# Patient Record
Sex: Female | Born: 1965 | Race: Black or African American | Hispanic: No | Smoking: Never smoker
Health system: Southern US, Community
[De-identification: ages and names within clinical notes are randomized; demographics above are authoritative.]

## PROBLEM LIST (undated history)

## (undated) DIAGNOSIS — C189 Malignant neoplasm of colon, unspecified: Secondary | ICD-10-CM

## (undated) DIAGNOSIS — D490 Neoplasm of unspecified behavior of digestive system: Secondary | ICD-10-CM

## (undated) DIAGNOSIS — I10 Essential (primary) hypertension: Secondary | ICD-10-CM

## (undated) DIAGNOSIS — C229 Malignant neoplasm of liver, not specified as primary or secondary: Secondary | ICD-10-CM

## (undated) DIAGNOSIS — C801 Malignant (primary) neoplasm, unspecified: Secondary | ICD-10-CM

## (undated) HISTORY — PX: FOOT SURGERY: SHX648

## (undated) HISTORY — PX: TUMOR REMOVAL: SHX12

---

## 1998-10-04 ENCOUNTER — Other Ambulatory Visit: Admission: RE | Admit: 1998-10-04 | Discharge: 1998-10-04 | Payer: Self-pay | Admitting: Obstetrics & Gynecology

## 1998-10-21 ENCOUNTER — Other Ambulatory Visit: Admission: RE | Admit: 1998-10-21 | Discharge: 1998-10-21 | Payer: Self-pay | Admitting: Obstetrics and Gynecology

## 2007-11-30 ENCOUNTER — Other Ambulatory Visit: Admission: RE | Admit: 2007-11-30 | Discharge: 2007-11-30 | Payer: Self-pay | Admitting: Internal Medicine

## 2008-09-25 ENCOUNTER — Emergency Department (HOSPITAL_COMMUNITY): Admission: EM | Admit: 2008-09-25 | Discharge: 2008-09-26 | Payer: Self-pay | Admitting: Emergency Medicine

## 2008-12-19 ENCOUNTER — Encounter: Admission: RE | Admit: 2008-12-19 | Discharge: 2008-12-19 | Payer: Self-pay | Admitting: Family Medicine

## 2009-02-28 ENCOUNTER — Encounter: Admission: RE | Admit: 2009-02-28 | Discharge: 2009-02-28 | Payer: Self-pay | Admitting: Family Medicine

## 2010-07-08 ENCOUNTER — Emergency Department (HOSPITAL_COMMUNITY)
Admission: EM | Admit: 2010-07-08 | Discharge: 2010-07-08 | Payer: Self-pay | Source: Home / Self Care | Admitting: Emergency Medicine

## 2010-07-09 ENCOUNTER — Encounter (INDEPENDENT_AMBULATORY_CARE_PROVIDER_SITE_OTHER): Payer: Self-pay | Admitting: Emergency Medicine

## 2010-07-09 ENCOUNTER — Ambulatory Visit (HOSPITAL_COMMUNITY)
Admission: RE | Admit: 2010-07-09 | Discharge: 2010-07-09 | Payer: Self-pay | Source: Home / Self Care | Attending: Emergency Medicine | Admitting: Emergency Medicine

## 2010-08-10 ENCOUNTER — Encounter: Payer: Self-pay | Admitting: Internal Medicine

## 2010-10-30 LAB — DIFFERENTIAL
Basophils Absolute: 0 10*3/uL (ref 0.0–0.1)
Basophils Relative: 1 % (ref 0–1)
Eosinophils Absolute: 0.2 10*3/uL (ref 0.0–0.7)
Eosinophils Relative: 3 % (ref 0–5)
Lymphocytes Relative: 27 % (ref 12–46)

## 2010-10-30 LAB — BASIC METABOLIC PANEL
Calcium: 8.8 mg/dL (ref 8.4–10.5)
GFR calc non Af Amer: >60 mL/min (ref >60)
Glucose, Bld: 122 mg/dL — ABNORMAL HIGH (ref 70–99)
Potassium: 3.4 mEq/L — ABNORMAL LOW (ref 3.5–5.1)
Sodium: 135 mEq/L (ref 135–145)

## 2010-10-30 LAB — HEPATIC FUNCTION PANEL
ALT: 19 U/L (ref 0–35)
AST: 21 U/L (ref 0–37)
Albumin: 3.4 g/dL — ABNORMAL LOW (ref 3.5–5.2)
Alkaline Phosphatase: 59 U/L (ref 39–117)
Bilirubin, Direct: <0.1 mg/dL (ref 0.0–0.3)
Total Bilirubin: 0.5 mg/dL (ref 0.3–1.2)

## 2010-10-30 LAB — CBC
HCT: 33.3 % — ABNORMAL LOW (ref 36.0–46.0)
MCHC: 33.7 g/dL (ref 30.0–36.0)
MCV: 81 fL (ref 78.0–100.0)
Platelets: 271 10*3/uL (ref 150–400)
RDW: 12.5 % (ref 11.5–15.5)

## 2010-10-30 LAB — POCT CARDIAC MARKERS

## 2010-12-15 ENCOUNTER — Emergency Department (HOSPITAL_COMMUNITY): Payer: Self-pay

## 2010-12-15 ENCOUNTER — Emergency Department (HOSPITAL_COMMUNITY)
Admission: EM | Admit: 2010-12-15 | Discharge: 2010-12-15 | Disposition: A | Discharge status: Home / Self Care | Payer: Self-pay | Attending: Emergency Medicine | Admitting: Emergency Medicine

## 2010-12-15 DIAGNOSIS — R220 Localized swelling, mass and lump, head: Secondary | ICD-10-CM | POA: Insufficient documentation

## 2010-12-15 DIAGNOSIS — R6883 Chills (without fever): Secondary | ICD-10-CM | POA: Insufficient documentation

## 2010-12-15 DIAGNOSIS — R05 Cough: Secondary | ICD-10-CM | POA: Insufficient documentation

## 2010-12-15 DIAGNOSIS — K219 Gastro-esophageal reflux disease without esophagitis: Secondary | ICD-10-CM | POA: Insufficient documentation

## 2010-12-15 DIAGNOSIS — R079 Chest pain, unspecified: Secondary | ICD-10-CM | POA: Insufficient documentation

## 2010-12-15 DIAGNOSIS — R209 Unspecified disturbances of skin sensation: Secondary | ICD-10-CM | POA: Insufficient documentation

## 2010-12-15 DIAGNOSIS — R51 Headache: Secondary | ICD-10-CM | POA: Insufficient documentation

## 2010-12-15 DIAGNOSIS — R002 Palpitations: Secondary | ICD-10-CM | POA: Insufficient documentation

## 2010-12-15 DIAGNOSIS — R0602 Shortness of breath: Secondary | ICD-10-CM | POA: Insufficient documentation

## 2010-12-15 DIAGNOSIS — R0609 Other forms of dyspnea: Secondary | ICD-10-CM | POA: Insufficient documentation

## 2010-12-15 DIAGNOSIS — R000 Tachycardia, unspecified: Secondary | ICD-10-CM | POA: Insufficient documentation

## 2010-12-15 DIAGNOSIS — R059 Cough, unspecified: Secondary | ICD-10-CM | POA: Insufficient documentation

## 2010-12-15 DIAGNOSIS — R0989 Other specified symptoms and signs involving the circulatory and respiratory systems: Secondary | ICD-10-CM | POA: Insufficient documentation

## 2010-12-15 DIAGNOSIS — F988 Other specified behavioral and emotional disorders with onset usually occurring in childhood and adolescence: Secondary | ICD-10-CM | POA: Insufficient documentation

## 2010-12-15 DIAGNOSIS — J3489 Other specified disorders of nose and nasal sinuses: Secondary | ICD-10-CM | POA: Insufficient documentation

## 2012-12-08 ENCOUNTER — Encounter (HOSPITAL_COMMUNITY): Payer: Self-pay | Admitting: *Deleted

## 2012-12-08 DIAGNOSIS — M545 Low back pain, unspecified: Secondary | ICD-10-CM | POA: Insufficient documentation

## 2012-12-08 DIAGNOSIS — N390 Urinary tract infection, site not specified: Secondary | ICD-10-CM | POA: Insufficient documentation

## 2012-12-08 DIAGNOSIS — Z3202 Encounter for pregnancy test, result negative: Secondary | ICD-10-CM | POA: Insufficient documentation

## 2012-12-08 DIAGNOSIS — M5430 Sciatica, unspecified side: Secondary | ICD-10-CM | POA: Insufficient documentation

## 2012-12-08 DIAGNOSIS — Z79899 Other long term (current) drug therapy: Secondary | ICD-10-CM | POA: Insufficient documentation

## 2012-12-08 LAB — POCT PREGNANCY, URINE: Preg Test, Ur: NEGATIVE

## 2012-12-08 LAB — URINALYSIS, ROUTINE W REFLEX MICROSCOPIC
Glucose, UA: NEGATIVE mg/dL
Ketones, ur: NEGATIVE mg/dL
Nitrite: NEGATIVE
Protein, ur: NEGATIVE mg/dL

## 2012-12-08 LAB — COMPREHENSIVE METABOLIC PANEL
AST: 19 U/L (ref 0–37)
Albumin: 3.8 g/dL (ref 3.5–5.2)
BUN: 17 mg/dL (ref 6–23)
CO2: 23 mEq/L (ref 19–32)
Calcium: 9.4 mg/dL (ref 8.4–10.5)
Creatinine, Ser: 1.04 mg/dL (ref 0.50–1.10)
GFR calc non Af Amer: 63 mL/min — ABNORMAL LOW (ref >90)
Total Bilirubin: 0.4 mg/dL (ref 0.3–1.2)

## 2012-12-08 LAB — CBC
HCT: 28.8 % — ABNORMAL LOW (ref 36.0–46.0)
MCH: 22 pg — ABNORMAL LOW (ref 26.0–34.0)
MCV: 69.6 fL — ABNORMAL LOW (ref 78.0–100.0)
Platelets: 341 10*3/uL (ref 150–400)
RDW: 16.6 % — ABNORMAL HIGH (ref 11.5–15.5)

## 2012-12-08 LAB — LIPASE, BLOOD: Lipase: 39 U/L (ref 11–59)

## 2012-12-08 LAB — URINE MICROSCOPIC-ADD ON

## 2012-12-08 NOTE — ED Notes (Signed)
Pt reports (L) flank pain x 3-4 days.  States that the pain radiates around to her groin.  Pt was incontinent of urine this evening and reports difficulty urinating.

## 2012-12-09 ENCOUNTER — Emergency Department (HOSPITAL_COMMUNITY)
Admission: EM | Admit: 2012-12-09 | Discharge: 2012-12-09 | Disposition: A | Discharge status: Home / Self Care | Payer: Self-pay | Attending: Emergency Medicine | Admitting: Emergency Medicine

## 2012-12-09 DIAGNOSIS — N390 Urinary tract infection, site not specified: Secondary | ICD-10-CM

## 2012-12-09 DIAGNOSIS — M5432 Sciatica, left side: Secondary | ICD-10-CM

## 2012-12-09 MED ORDER — PREDNISONE 20 MG PO TABS: ORAL_TABLET | ORAL | Status: DC

## 2012-12-09 MED ORDER — CYCLOBENZAPRINE HCL 10 MG PO TABS: 10.0000 mg | ORAL_TABLET | Freq: Two times a day (BID) | ORAL | Status: DC | PRN

## 2012-12-09 MED ORDER — HYDROCODONE-ACETAMINOPHEN 5-325 MG PO TABS: 2.0000 | ORAL_TABLET | ORAL | Status: DC | PRN

## 2012-12-09 MED ORDER — NAPROXEN 500 MG PO TABS: 500.0000 mg | ORAL_TABLET | Freq: Two times a day (BID) | ORAL | Status: DC

## 2012-12-09 MED ORDER — SULFAMETHOXAZOLE-TRIMETHOPRIM 800-160 MG PO TABS: 1.0000 | ORAL_TABLET | Freq: Two times a day (BID) | ORAL | Status: DC

## 2012-12-09 NOTE — ED Provider Notes (Signed)
History     CSN: [id number]  Arrival date & time 12/08/12  2239   First MD Initiated Contact with Patient 12/09/12 0054      Chief Complaint  Patient presents with  . Flank Pain    (Consider location/radiation/quality/duration/timing/severity/associated sxs/prior treatment) HPI Comments: Patient comes to the ER for evaluation of pain in the left lower back area that started 4 days ago. Patient reports that the pain is sharp and worsens with bending and twisting. She denies any injury. She has noticed that the pain started to radiate down into her left hip and at times only down the left thigh into the knee area. No numbness, tingling or weakness. She has not noticed any change in bowel or bladder function, denies urinary symptoms. Patient was a IT consultant, does a lot of manual labor at work.  Patient is a 47 y.o. female presenting with flank pain.  Flank Pain    History reviewed. No pertinent past medical history.  History reviewed. No pertinent past surgical history.  History reviewed. No pertinent family history.  History  Substance Use Topics  . Smoking status: Never Smoker   . Smokeless tobacco: Not on file  . Alcohol Use: No    OB History   Grav Para Term Preterm Abortions TAB SAB Ect Mult Living                  Review of Systems  Gastrointestinal: Negative for nausea, vomiting, diarrhea and constipation.  Genitourinary: Positive for flank pain. Negative for dysuria, frequency and hematuria.  All other systems reviewed and are negative.    Allergies  Review of patient's allergies indicates no known allergies.  Home Medications   Current Outpatient Rx  Name  Route  Sig  Dispense  Refill  . amphetamine-dextroamphetamine (ADDERALL) 20 MG tablet   Oral   Take 20 mg by mouth 2 (two) times daily.         . citalopram (CELEXA) 10 MG tablet   Oral   Take 10 mg by mouth daily.           There were no vitals taken for this visit.  Physical Exam   Constitutional: She is oriented to person, place, and time. She appears well-developed and well-nourished. No distress.  HENT:  Head: Normocephalic and atraumatic.  Right Ear: Hearing normal.  Left Ear: Hearing normal.  Nose: Nose normal.  Mouth/Throat: Oropharynx is clear and moist and mucous membranes are normal.  Eyes: Conjunctivae and EOM are normal. Pupils are equal, round, and reactive to light.  Neck: Normal range of motion. Neck supple.  Cardiovascular: Regular rhythm, S1 normal and S2 normal.  Exam reveals no gallop and no friction rub.   No murmur heard. Pulmonary/Chest: Effort normal and breath sounds normal. No respiratory distress. She exhibits no tenderness.  Abdominal: Soft. Normal appearance and bowel sounds are normal. There is no hepatosplenomegaly. There is no tenderness. There is no rebound, no guarding, no tenderness at McBurney's point and negative Murphy's sign. No hernia.  Musculoskeletal: Normal range of motion.       Lumbar back: She exhibits tenderness. She exhibits no bony tenderness.       Back:  Neurological: She is alert and oriented to person, place, and time. She has normal strength. No cranial nerve deficit or sensory deficit. Coordination normal. GCS eye subscore is 4. GCS verbal subscore is 5. GCS motor subscore is 6.  Reflex Scores:      Patellar reflexes are 2+ on  the right side and 2+ on the left side. Skin: Skin is warm, dry and intact. No rash noted. No cyanosis.  Psychiatric: She has a normal mood and affect. Her speech is normal and behavior is normal. Thought content normal.    ED Course  Procedures (including critical care time)  Labs Reviewed  CBC - Abnormal; Notable for the following:    Hemoglobin 9.1 (*)    HCT 28.8 (*)    MCV 69.6 (*)    MCH 22.0 (*)    RDW 16.6 (*)    All other components within normal limits  COMPREHENSIVE METABOLIC PANEL - Abnormal; Notable for the following:    Glucose, Bld 114 (*)    GFR calc non Af Amer 63  (*)    GFR calc Af Amer 73 (*)    All other components within normal limits  URINALYSIS, ROUTINE W REFLEX MICROSCOPIC - Abnormal; Notable for the following:    APPearance CLOUDY (*)    Bilirubin Urine SMALL (*)    Leukocytes, UA SMALL (*)    All other components within normal limits  URINE MICROSCOPIC-ADD ON - Abnormal; Notable for the following:    Squamous Epithelial / LPF FEW (*)    All other components within normal limits  LIPASE, BLOOD  POCT PREGNANCY, URINE   No results found.   Diagnosis: Sciatica    MDM  Patient presents to the ER with complaints of pain in the left lower back area which has now become particular down into the leg region. She has normal strength, sensation and reflexes in the lower extremities. Urinalysis was equivocal, possible infection present. No obvious hematuria or concern for kidney stone based on the patient's cousin patient. Her abdominal exam is benign, no sign of acute surgical process. Patient will be treated with empiric sciatica treatment. Will add Bactrim because of the urinary findings. Patient is to rest for 2 days.        Gilda Crease, MD 12/09/12 [phone]

## 2012-12-13 ENCOUNTER — Encounter (HOSPITAL_COMMUNITY): Payer: Self-pay | Admitting: *Deleted

## 2012-12-13 ENCOUNTER — Emergency Department (HOSPITAL_COMMUNITY)
Admission: EM | Admit: 2012-12-13 | Discharge: 2012-12-13 | Disposition: A | Discharge status: Home / Self Care | Payer: Self-pay | Attending: Emergency Medicine | Admitting: Emergency Medicine

## 2012-12-13 ENCOUNTER — Emergency Department (HOSPITAL_COMMUNITY): Payer: Self-pay

## 2012-12-13 DIAGNOSIS — IMO0002 Reserved for concepts with insufficient information to code with codable children: Secondary | ICD-10-CM | POA: Insufficient documentation

## 2012-12-13 DIAGNOSIS — Z87440 Personal history of urinary (tract) infections: Secondary | ICD-10-CM | POA: Insufficient documentation

## 2012-12-13 DIAGNOSIS — M79609 Pain in unspecified limb: Secondary | ICD-10-CM | POA: Insufficient documentation

## 2012-12-13 DIAGNOSIS — Z79899 Other long term (current) drug therapy: Secondary | ICD-10-CM | POA: Insufficient documentation

## 2012-12-13 DIAGNOSIS — M5416 Radiculopathy, lumbar region: Secondary | ICD-10-CM

## 2012-12-13 DIAGNOSIS — N838 Other noninflammatory disorders of ovary, fallopian tube and broad ligament: Secondary | ICD-10-CM | POA: Insufficient documentation

## 2012-12-13 LAB — URINE MICROSCOPIC-ADD ON

## 2012-12-13 LAB — URINALYSIS, ROUTINE W REFLEX MICROSCOPIC
Bilirubin Urine: NEGATIVE
Glucose, UA: NEGATIVE mg/dL
Hgb urine dipstick: NEGATIVE
Specific Gravity, Urine: 1.026 (ref 1.005–1.030)
pH: 6 (ref 5.0–8.0)

## 2012-12-13 MED ORDER — SODIUM CHLORIDE 0.9 % IV BOLUS (SEPSIS)
1000.0000 mL | Freq: Once | INTRAVENOUS | Status: AC
  Administered 2012-12-13: 1000 mL via INTRAVENOUS

## 2012-12-13 MED ORDER — HYDROMORPHONE HCL PF 1 MG/ML IJ SOLN
1.0000 mg | Freq: Once | INTRAMUSCULAR | Status: AC
  Administered 2012-12-13: 1 mg via INTRAVENOUS
  Filled 2012-12-13: qty 1

## 2012-12-13 MED ORDER — OXYCODONE-ACETAMINOPHEN 5-325 MG PO TABS: 1.0000 | ORAL_TABLET | Freq: Four times a day (QID) | ORAL | Status: DC | PRN

## 2012-12-13 MED ORDER — ONDANSETRON HCL 4 MG/2ML IJ SOLN
4.0000 mg | Freq: Once | INTRAMUSCULAR | Status: AC
  Administered 2012-12-13: 4 mg via INTRAVENOUS
  Filled 2012-12-13: qty 2

## 2012-12-13 NOTE — ED Notes (Signed)
Pt is here with 5 day history of left lower back pain that now is radiating down left leg.  Pt now reports numbness to the left leg numbness for 4 days. Pulse present.  Pt has pain with movement.  Pt reports she had some tingling in right hand and right leg for one short episode

## 2012-12-13 NOTE — ED Notes (Signed)
Spoke to MRI aware of pt, will be able to send for pt around 20:00

## 2012-12-13 NOTE — ED Notes (Signed)
Pt complaining of left side pain, and left leg pain.

## 2012-12-13 NOTE — ED Notes (Addendum)
Pt reports pain to left lower back radiating to left leg. Here four days ago and dx with sciatica. States medications are not helping. Pain worse, now reports numbness from left hip to left knee. Pain improved when standing. Also reports urinary incontinence. No fecal incontinence.

## 2012-12-13 NOTE — ED Notes (Signed)
MD at bedside. 

## 2012-12-13 NOTE — ED Provider Notes (Signed)
History     CSN: [id number]  Arrival date & time 12/13/12  1449   First MD Initiated Contact with Patient 12/13/12 1842      Chief Complaint  Patient presents with  . Back Pain  . Leg Pain    (Consider location/radiation/quality/duration/timing/severity/associated sxs/prior treatment) Patient is a 47 y.o. female presenting with back pain and leg pain.  Back Pain Associated symptoms: leg pain   Leg Pain Associated symptoms: back pain    Pt with no significant PMH has had about a week of L lower back pain radiating into L leg. Seen for same 4 days ago, had labs and UA which was concerning for early UTI. Treated for sciatica and UTI. She reports no improvement and in fact symptoms have been worsening. States entire L leg is numb, no fecal incontinence or urinary retention. Denies any specific injury but does a lot of lifting and bending at work.   History reviewed. No pertinent past medical history.  History reviewed. No pertinent past surgical history.  No family history on file.  History  Substance Use Topics  . Smoking status: Never Smoker   . Smokeless tobacco: Not on file  . Alcohol Use: No    OB History   Grav Para Term Preterm Abortions TAB SAB Ect Mult Living                  Review of Systems  Musculoskeletal: Positive for back pain.   All other systems reviewed and are negative except as noted in HPI.   Allergies  Review of patient's allergies indicates no known allergies.  Home Medications   Current Outpatient Rx  Name  Route  Sig  Dispense  Refill  . amphetamine-dextroamphetamine (ADDERALL) 20 MG tablet   Oral   Take 20 mg by mouth 2 (two) times daily.         . citalopram (CELEXA) 10 MG tablet   Oral   Take 10 mg by mouth daily.         . cyclobenzaprine (FLEXERIL) 10 MG tablet   Oral   Take 1 tablet (10 mg total) by mouth 2 (two) times daily as needed for muscle spasms.   20 tablet   0   . HYDROcodone-acetaminophen (NORCO/VICODIN)  5-325 MG per tablet   Oral   Take 2 tablets by mouth every 4 (four) hours as needed for pain.   15 tablet   0   . naproxen (NAPROSYN) 500 MG tablet   Oral   Take 1 tablet (500 mg total) by mouth 2 (two) times daily.   30 tablet   0   . predniSONE (DELTASONE) 20 MG tablet      3 tabs po daily x 3 days, then 2 tabs x 3 days, then 1.5 tabs x 3 days, then 1 tab x 3 days, then 0.5 tabs x 3 days   27 tablet   0   . sulfamethoxazole-trimethoprim (SEPTRA DS) 800-160 MG per tablet   Oral   Take 1 tablet by mouth every 12 (twelve) hours.   10 tablet   0     BP 174/86  Pulse 67  Temp(Src) 98.4 F (36.9 C) (Oral)  Resp 16  Ht 5\' 5"  (1.651 m)  Wt 144 lb 8 oz (65.545 kg)  BMI 24.05 kg/m2  SpO2 100%  Physical Exam  Nursing note and vitals reviewed. Constitutional: She is oriented to person, place, and time. She appears well-developed and well-nourished.  HENT:  Head: Normocephalic  and atraumatic.  Eyes: EOM are normal. Pupils are equal, round, and reactive to light.  Neck: Normal range of motion. Neck supple.  Cardiovascular: Normal rate, normal heart sounds and intact distal pulses.   Pulmonary/Chest: Effort normal and breath sounds normal.  Abdominal: Bowel sounds are normal. She exhibits no distension. There is no tenderness.  Musculoskeletal: Normal range of motion. She exhibits tenderness (L lower back and sciatic notch). She exhibits no edema.  Neurological: She is alert and oriented to person, place, and time. She has normal strength. She displays normal reflexes. No cranial nerve deficit or sensory deficit.  Positive ipsilateral and contralateral straight leg raise  Skin: Skin is warm and dry. No rash noted.  Psychiatric: She has a normal mood and affect.    ED Course  Procedures (including critical care time)  Labs Reviewed  URINALYSIS, ROUTINE W REFLEX MICROSCOPIC - Abnormal; Notable for the following:    Leukocytes, UA SMALL (*)    All other components within  normal limits  URINE MICROSCOPIC-ADD ON - Abnormal; Notable for the following:    Squamous Epithelial / LPF FEW (*)    Bacteria, UA FEW (*)    All other components within normal limits  URINE CULTURE   Mr Lumbar Spine Wo Contrast  12/13/2012   *RADIOLOGY REPORT*  Clinical Data: Lumbar radiculopathy.  Left leg pain  MRI LUMBAR SPINE WITHOUT CONTRAST  Technique:  Multiplanar and multiecho pulse sequences of the lumbar spine were obtained without intravenous contrast.  Comparison: None.  Findings: Normal alignment.  Negative for fracture.  Conus medullaris is normal and terminates at L1-2.  L1-2:  Negative  L2-3:  Negative  L3-4:  Small to moderate  left lateral disc protrusion which could cause impingement of the left L3 nerve root.  L4-5: Small left-sided disc protrusion in the foramen and lateral to the foramen.  Minimal displacement of the left L4 nerve root lateral to the foramen.  Mild facet hypertrophy  L5-S1:  Mild disc degeneration without stenosis.  6.4 x 7.3 cm cyst in the left pelvis.  This appears to be retroperitoneal rather than adnexal in  location.  There is mild nodularity of the wall of the cyst.  There is a similar smaller cyst on the right measuring 22 mm.  This is located medial to the right common iliac artery.  IMPRESSION: Left lateral disc protrusion L3-4 with displacement of the left L3 nerve root.  This could be a symptomatic disc protrusion.  Small left foraminal and left lateral disc protrusion L4-5.  Large complex cyst in the left pelvis with nodularity in the wall. This raises the possibility of neoplasm.  There is a similar smaller cyst on the right.  Further evaluation of the pelvis is suggested to  determine this could be ovarian etiology versus retroperitoneal.  MRI of the pelvis with contrast is suggested for further evaluation.   Original Report Authenticated By: Janeece Riggers, M.D.     1. Lumbar radiculopathy   2. Pelvic cyst       MDM  Reviewed MRI results with  the patient and family at bedside including incidental pelvic mass. Her current symptoms likely due to lumbar disk protrusion, advised Neurosurgery followup. Also recommend close follow up for pelvic mass/cyst. She has Ob/Gyn.         Charles B. Bernette Mayers, MD 12/13/12 2131

## 2012-12-14 LAB — URINE CULTURE: Culture: NO GROWTH

## 2012-12-20 ENCOUNTER — Ambulatory Visit (INDEPENDENT_AMBULATORY_CARE_PROVIDER_SITE_OTHER): Payer: Self-pay | Admitting: Sports Medicine

## 2012-12-20 ENCOUNTER — Encounter: Payer: Self-pay | Admitting: Sports Medicine

## 2012-12-20 VITALS — BP 124/80 | Ht 65.0 in | Wt 142.0 lb

## 2012-12-20 DIAGNOSIS — M5416 Radiculopathy, lumbar region: Secondary | ICD-10-CM

## 2012-12-20 DIAGNOSIS — IMO0002 Reserved for concepts with insufficient information to code with codable children: Secondary | ICD-10-CM

## 2012-12-20 MED ORDER — KETOROLAC TROMETHAMINE 60 MG/2ML IM SOLN
60.0000 mg | Freq: Once | INTRAMUSCULAR | Status: AC
  Administered 2012-12-20: 60 mg via INTRAMUSCULAR

## 2012-12-20 MED ORDER — PREDNISONE 10 MG PO KIT: PACK | ORAL | Status: DC

## 2012-12-20 MED ORDER — METHYLPREDNISOLONE ACETATE 80 MG/ML IJ SUSP
80.0000 mg | Freq: Once | INTRAMUSCULAR | Status: AC
  Administered 2012-12-20: 80 mg via INTRAMUSCULAR

## 2012-12-20 MED ORDER — PREGABALIN 75 MG PO CAPS: ORAL_CAPSULE | ORAL | Status: DC

## 2012-12-20 NOTE — Progress Notes (Signed)
Subjective:    Patient ID: Sheila Perkins, female    DOB: [birth year], 47 y.o.   MRN: [id number]  HPI chief complaint low back pain  Very pleasant 47 year old female comes in today complaining of low back pain. Pain began rather acutely on May 20 but without any specific injury. She localizes the pain to the left side of her low back with radiating pain down her left thigh and just inferior to the left knee. There is associated numbness and tingling as well. She was initially seen in the emergency room on May 23. She was placed on antibiotics for a urinary tract infection and also treated for sciatica. When her pain persisted she was seen back in the emergency room on May 27. An MRI at that time showed a left lateral disc protrusion at L3-L4 with displacement of the left L3 nerve root. She was placed on prednisone, Flexeril, and Percocet. Prednisone was somewhat helpful but her pain has never resolved. Percocet makes her sick to her stomach and Flexeril may her drowsy. She denies any similar problems in the past. No change in bowel or bladder. No radiating pain into the right leg. Pain seems to be worse sitting. She has an appointment to see neurosurgery (Dr Venetia Maxon) in 2 weeks. She is here today with her husband.  Past medical history and medications are reviewed No known drug allergies She does not smoke, denies alcohol use, and owns a cleaning business     Review of Systems     Objective:   Physical Exam Well-developed, well-nourished. Some mild distress due to her pain.  Lumbar spine: Limited motion secondary to pain. Diffuse spasm of the paraspinal musculature. Positive straight leg raise on the left, negative on the right. Strength is 5/5 both lower extremities with reflexes 1/4 and equal at the Achilles and patellar tendons. There is some decreased sensation to light touch along the left quad compared to the right. Otherwise, sensation is intact to light-touch. No muscle atrophy.  MRI  scan of her lumbar spine dated May 27 is reviewed. Dominant finding is the left lateral disc protrusion at L3-L4 with displacement of the left L3 nerve root. It is a small to moderate disc protrusion. There is also a small left-sided disc protrusion at L4-L5 with slight displacement of the left L4 nerve. There is also a cystic mass in the left pelvis for which an MRI of the pelvis with contrast was suggested by the radiologist to rule out neoplasm.       Assessment & Plan:  1. Low back pain with left leg radiculopathy 2. Pelvic cyst  80 mg of Depo-Medrol IM, 60 mg of Toradol IM. Sterapred Dosepak to take as directed for 6 days. She has noticed significant symptom relief with taking her husband's Lyrica. I've asked her to continue with 75 mg taking it twice daily for 5 days if symptoms are tolerable. She's instructed in a simple prone extension exercise to help work the disc back into place. I've asked her to keep her appointment with Dr. Venetia Maxon as scheduled in 2 weeks. If symptoms persist she may benefit from a lumbar epidural steroid injection but if pain remains intractable she may need surgery. Once she is feeling better she will need to embark on a lifelong core strengthening program.  In regards to the pelvic cyst, she has an appointment with her gynecologist, Dr. Su Hilt, on June 11th. She and her husband both understand the importance of having the cyst further evaluated. She has  no personal history of cancer and although her mother is a pancreatic cancer survivor there is no history of breast or ovarian cancer in the family.  Followup with me when necessary.

## 2012-12-20 NOTE — Patient Instructions (Addendum)
Take your prednisone as follows:  60 mg on day one 50 mg on day 2 40 mg on day 3 30 mg on day 4 20 mg on day 5 10 mg on day 6

## 2012-12-27 ENCOUNTER — Ambulatory Visit: Payer: Self-pay | Admitting: Sports Medicine

## 2012-12-28 ENCOUNTER — Other Ambulatory Visit: Payer: Self-pay | Admitting: Obstetrics and Gynecology

## 2012-12-28 ENCOUNTER — Ambulatory Visit (INDEPENDENT_AMBULATORY_CARE_PROVIDER_SITE_OTHER): Payer: Self-pay | Admitting: Sports Medicine

## 2012-12-28 VITALS — Ht 65.0 in | Wt 142.0 lb

## 2012-12-28 DIAGNOSIS — IMO0002 Reserved for concepts with insufficient information to code with codable children: Secondary | ICD-10-CM

## 2012-12-28 DIAGNOSIS — M5416 Radiculopathy, lumbar region: Secondary | ICD-10-CM

## 2012-12-28 DIAGNOSIS — R1900 Intra-abdominal and pelvic swelling, mass and lump, unspecified site: Secondary | ICD-10-CM

## 2012-12-28 MED ORDER — PREDNISONE 10 MG PO KIT: PACK | ORAL | Status: DC

## 2012-12-29 NOTE — Progress Notes (Signed)
  Subjective:    Patient ID: Sheila Perkins, female    DOB: 20-Nov-1965, 47 y.o.   MRN: [id number]  HPI Patient comes in today requesting a refill of her prednisone. She is still struggling with left leg radiculopathy. She has completed a 6 day Sterapred Dosepak which was helpful but her pain has now returned. She has an appointment with the neurosurgeon next week. She is also began to notice some swelling in the left leg which is worse with activity. Swelling began yesterday. Earlier today she was seen by her gynecologist for workup of a pelvic mass. That mass appears to be compressing the common iliac vein and a followup MRI with contrast is scheduled in 48 hours. She is still taking her Lyrica and taking naproxen as well. She is here today with her husband.    Review of Systems     Objective:   Physical Exam Well-developed, well-nourished. No acute distress. Awake alert and oriented x3  Patient has a positive straight leg raise on the left. Global left extremity weakness secondary to pain. There is some mild edema of the left lower extremity. No calf pain, negative Homans. Left calf measures 35.5 cm compared to 34.0 cm on the right. Patient is walking with a limp.       Assessment & Plan:  1. Persistent left leg radiculopathy with MRI evidence lumbar disc herniation with encroachment on the left L3 nerve root 2. Pelvic mass, now with left lower extremity swelling  A second 6 day Sterapred Dosepak was provided. She was given crutches to help assist with ambulation. Continue with Lyrica 75 mg daily to twice a day. She has a followup appointment with her gynecologist immediately after her MRI with contrast on June 13th. Patient currently does not appear to have a DVT but she has a positive family history for DVT (no personal history). I think her lower extremity swelling is likely due to encroachment of one of the iliac veins in her pelvis but if she develops persistent swelling, pain, or  erythema that does not resolve with elevation then she and her husband are instructed to proceed to the emergency room for a Doppler to rule out DVT. She has an appointment with neurosurgery next week. I will see her when necessary.

## 2012-12-30 ENCOUNTER — Ambulatory Visit
Admission: RE | Admit: 2012-12-30 | Discharge: 2012-12-30 | Disposition: A | Discharge status: Home / Self Care | Payer: No Typology Code available for payment source | Source: Ambulatory Visit | Attending: Obstetrics and Gynecology | Admitting: Obstetrics and Gynecology

## 2012-12-30 DIAGNOSIS — R1900 Intra-abdominal and pelvic swelling, mass and lump, unspecified site: Secondary | ICD-10-CM

## 2012-12-30 MED ORDER — GADOBENATE DIMEGLUMINE 529 MG/ML IV SOLN
15.0000 mL | Freq: Once | INTRAVENOUS | Status: AC | PRN
  Administered 2012-12-30: 15 mL via INTRAVENOUS

## 2013-01-05 ENCOUNTER — Encounter (INDEPENDENT_AMBULATORY_CARE_PROVIDER_SITE_OTHER): Payer: Self-pay | Admitting: Surgery

## 2013-01-05 ENCOUNTER — Ambulatory Visit (INDEPENDENT_AMBULATORY_CARE_PROVIDER_SITE_OTHER): Payer: Self-pay | Admitting: Surgery

## 2013-01-05 VITALS — BP 130/80 | HR 78 | Temp 97.4°F | Resp 16 | Ht 65.0 in | Wt 146.8 lb

## 2013-01-05 DIAGNOSIS — R1900 Intra-abdominal and pelvic swelling, mass and lump, unspecified site: Secondary | ICD-10-CM

## 2013-01-05 NOTE — Progress Notes (Signed)
Chief Complaint:  Pelvic mass seen on MRI  1. Left adnexal/retroperitoneal cystic lesion with anterior  septation and minimal inferior nodularity which appears to mildly  enhance. Adjacent smaller cyst with single septation. The lesion  does not appear to arise from the ovary. I favor para-ovarian  cystadenoma. Cystic lymphangioma, multicystic mesothelioma, extra  ovarian endometrioma, and lymphocele considered less likely. Given  the size of the lesion, resection is likely warranted. The adjacent  smaller lesion might also be resected.   History of Present Illness:  Sheila Perkins is an 47 y.o. female referred by [name] and Dr. Su Hilt with the above findings.  The OVA1 test was 7.5 and I have spoken with [name] about how this will change our approach.  I think that I would be glad to serve as an assist after colon prep to operate on this lady.    I reviewed the abrupt onset of her pain and the MRI findings with the left bulging disks.    No past medical history on file.  Past Surgical History  Procedure Laterality Date  . Foot surgery      Current Outpatient Prescriptions  Medication Sig Dispense Refill  . amphetamine-dextroamphetamine (ADDERALL) 20 MG tablet Take 20 mg by mouth 2 (two) times daily.      . naproxen (NAPROSYN) 500 MG tablet Take 1 tablet (500 mg total) by mouth 2 (two) times daily.  30 tablet  0  . pregabalin (LYRICA) 75 MG capsule Take one tab BID for 5 days then prn  120 capsule  1  . citalopram (CELEXA) 10 MG tablet Take 10 mg by mouth daily.      . cyclobenzaprine (FLEXERIL) 10 MG tablet Take 1 tablet (10 mg total) by mouth 2 (two) times daily as needed for muscle spasms.  20 tablet  0   No current facility-administered medications for this visit.   Percocet Family History  Problem Relation Age of Onset  . Cancer Mother     pancreatic   Social History:   reports that she has never smoked. She has never used smokeless tobacco. She reports that she  does not drink alcohol or use illicit drugs.   REVIEW OF SYSTEMS - PERTINENT POSITIVES ONLY: noncontributory  Physical Exam:   Blood pressure 130/80, pulse 78, temperature 97.4 F (36.3 C), temperature source Temporal, resp. rate 16, height 5\' 5"  (1.651 m), weight 146 lb 12.8 oz (66.588 kg), last menstrual period 12/19/2012. Body mass index is 24.43 kg/(m^2).  Gen:  WDWN F NAD  Neurological: Alert and oriented to person, place, and time. Motor and sensory function is grossly intact  Head: Normocephalic and atraumatic.  Eyes: Conjunctivae are normal. Pupils are equal, round, and reactive to light. No scleral icterus.  Neck: Normal range of motion. Neck supple. No tracheal deviation or thyromegaly present.  Cardiovascular:  SR without murmurs or gallops.  No carotid bruits Respiratory: Effort normal.  No respiratory distress. No chest wall tenderness. Breath sounds normal.  No wheezes, rales or rhonchi.  Abdomen:  Nontender GU: Musculoskeletal: Normal range of motion. Extremities are nontender. No cyanosis, edema or clubbing noted.  Main problem is referred pain down the left leg.   Lymphadenopathy: No cervical, preauricular, postauricular or axillary adenopathy is present Skin: Skin is warm and dry. No rash noted. No diaphoresis. No erythema. No pallor. Pscyh: Normal mood and affect. Behavior is normal. Judgment and thought content normal.   LABORATORY RESULTS: No results found for this or any previous visit (  from the past 48 hour(s)).  RADIOLOGY RESULTS: No results found.  Problem List: There are no active problems to display for this patient.   Assessment & Plan: Could this be a mass impinging on her obturator nerve referring the pain down her leg?  Will wait to hear back from Dr. Les Pou office regarding disposition.     Matt B. Daphine Deutscher, MD, [facility] [phone] beeper [phone]  01/05/2013 12:21 PM

## 2013-01-06 ENCOUNTER — Ambulatory Visit (INDEPENDENT_AMBULATORY_CARE_PROVIDER_SITE_OTHER): Payer: Self-pay | Admitting: Surgery

## 2013-01-10 ENCOUNTER — Telehealth (INDEPENDENT_AMBULATORY_CARE_PROVIDER_SITE_OTHER): Payer: Self-pay | Admitting: General Surgery

## 2013-01-10 DIAGNOSIS — R1900 Intra-abdominal and pelvic swelling, mass and lump, unspecified site: Secondary | ICD-10-CM

## 2013-01-10 NOTE — Telephone Encounter (Signed)
Dr Daphine Deutscher called and spoke with patient. I called and made patient aware of appt date/time for referral to Dr Chauncey Mann. She is aware to pick up MR and take to appt. She was given the number to contact them if needed. Records faxed to [phone].

## 2013-01-10 NOTE — Telephone Encounter (Signed)
Referral made to Dr Chauncey Mann at [facility] for pelvic mass. Spoke with Norville Haggard [phone] and set up appt for Friday 01/13/2013 at 10:00 am. Need records faxed to [phone] and patient needs to pick up Disc of MR films to bring with her. I called patient to give her appt and she doesn't understand why she is being referred and wants to speak with Dr Daphine Deutscher first. I will page Dr Daphine Deutscher when he is out of surgery to call her at [phone] to answer questions.

## 2013-01-10 NOTE — Telephone Encounter (Signed)
Paged Dr Daphine Deutscher to contact patient at 11:18 am.

## 2013-01-11 NOTE — Telephone Encounter (Signed)
Confirmation received for faxed records.

## 2013-01-29 DIAGNOSIS — K5904 Chronic idiopathic constipation: Secondary | ICD-10-CM | POA: Insufficient documentation

## 2013-01-29 DIAGNOSIS — D3A8 Other benign neuroendocrine tumors: Secondary | ICD-10-CM | POA: Insufficient documentation

## 2013-02-13 DIAGNOSIS — B9681 Helicobacter pylori [H. pylori] as the cause of diseases classified elsewhere: Secondary | ICD-10-CM | POA: Insufficient documentation

## 2013-03-03 DIAGNOSIS — K219 Gastro-esophageal reflux disease without esophagitis: Secondary | ICD-10-CM | POA: Insufficient documentation

## 2013-03-11 ENCOUNTER — Encounter (HOSPITAL_COMMUNITY): Payer: Self-pay | Admitting: *Deleted

## 2013-03-11 ENCOUNTER — Emergency Department (HOSPITAL_COMMUNITY)
Admission: EM | Admit: 2013-03-11 | Discharge: 2013-03-11 | Payer: Self-pay | Attending: Emergency Medicine | Admitting: Emergency Medicine

## 2013-03-11 DIAGNOSIS — Z7982 Long term (current) use of aspirin: Secondary | ICD-10-CM | POA: Insufficient documentation

## 2013-03-11 DIAGNOSIS — K625 Hemorrhage of anus and rectum: Secondary | ICD-10-CM | POA: Insufficient documentation

## 2013-03-11 DIAGNOSIS — T819XXA Unspecified complication of procedure, initial encounter: Secondary | ICD-10-CM

## 2013-03-11 DIAGNOSIS — K929 Disease of digestive system, unspecified: Secondary | ICD-10-CM | POA: Insufficient documentation

## 2013-03-11 DIAGNOSIS — Y849 Medical procedure, unspecified as the cause of abnormal reaction of the patient, or of later complication, without mention of misadventure at the time of the procedure: Secondary | ICD-10-CM | POA: Insufficient documentation

## 2013-03-11 DIAGNOSIS — D649 Anemia, unspecified: Secondary | ICD-10-CM | POA: Insufficient documentation

## 2013-03-11 DIAGNOSIS — Z3202 Encounter for pregnancy test, result negative: Secondary | ICD-10-CM | POA: Insufficient documentation

## 2013-03-11 HISTORY — DX: Neoplasm of unspecified behavior of digestive system: D49.0

## 2013-03-11 LAB — CBC WITH DIFFERENTIAL/PLATELET
Basophils Absolute: 0 10*3/uL (ref 0.0–0.1)
Basophils Relative: 0 % (ref 0–1)
Hemoglobin: 6.6 g/dL — CL (ref 12.0–15.0)
MCHC: 29.1 g/dL — ABNORMAL LOW (ref 30.0–36.0)
Neutro Abs: 7.1 10*3/uL (ref 1.7–7.7)
Neutrophils Relative %: 75 % (ref 43–77)
Platelets: 417 10*3/uL — ABNORMAL HIGH (ref 150–400)
RDW: 18.3 % — ABNORMAL HIGH (ref 11.5–15.5)

## 2013-03-11 LAB — BASIC METABOLIC PANEL
BUN: 11 mg/dL (ref 6–23)
Creatinine, Ser: 0.74 mg/dL (ref 0.50–1.10)
GFR calc Af Amer: >90 mL/min (ref >90)
GFR calc non Af Amer: >90 mL/min (ref >90)
Potassium: 3.4 mEq/L — ABNORMAL LOW (ref 3.5–5.1)

## 2013-03-11 MED ORDER — SODIUM CHLORIDE 0.9 % IV BOLUS (SEPSIS)
1000.0000 mL | Freq: Once | INTRAVENOUS | Status: AC
  Administered 2013-03-11: 1000 mL via INTRAVENOUS

## 2013-03-11 MED ORDER — ONDANSETRON HCL 4 MG/2ML IJ SOLN
4.0000 mg | Freq: Once | INTRAMUSCULAR | Status: AC
  Administered 2013-03-11: 4 mg via INTRAVENOUS
  Filled 2013-03-11: qty 2

## 2013-03-11 NOTE — ED Notes (Signed)
rn called charge rn at [facility]. Karoline Caldwell, RN [phone]  Will call Angie back when [facility] arrives to transport pt.

## 2013-03-11 NOTE — ED Notes (Signed)
Sheila Perkins aware of hmgb 6.6

## 2013-03-11 NOTE — ED Notes (Signed)
pts sister Verlon Au [phone]

## 2013-03-11 NOTE — ED Notes (Signed)
Pt signed blood consent form  

## 2013-03-11 NOTE — ED Notes (Signed)
As my colleague goes to lunch, I begin transfusion for this pt.  Pt. Is awake, alert, cheerful and in no distress.

## 2013-03-11 NOTE — ED Notes (Signed)
[facility] at bedside, 1 unit of blood being transfused. Angie, rn at [facility] called and alerted.

## 2013-03-11 NOTE — ED Notes (Signed)
md at bedside  Pt alert and oriented x4. Respirations even and unlabored, bilateral symmetrical rise and fall of chest. Skin warm and dry. In no acute distress. Denies needs.   

## 2013-03-11 NOTE — ED Provider Notes (Signed)
CSN: [id number]     Arrival date & time 03/11/13  1133 History     First MD Initiated Contact with Patient 03/11/13 1138     Chief Complaint  Patient presents with  . Rectal Bleeding  . rectal surgery 1 week ago    Patient is a 47 y.o. female presenting with hematochezia. The history is provided by the patient.  Rectal Bleeding Quality:  Bright red Amount:  Moderate Timing:  Intermittent Progression:  Worsening Chronicity:  New Context comment:  Anal surgery Relieved by: rest. Worsened by:  Defecation Associated symptoms: dizziness   Associated symptoms: no abdominal pain, no fever and no vomiting   pt presents with rectal bleeding She has rectal surgery on 8/15.  She did well but did have small amt of bleeding postoperatively However in past 24 hours her bleeding has increased and she is now passing blood clots No vag bleeding No new abd pain She reports nausea No fever is reported She is not on active chemotherapy  PMH - rectal tumor  Past Surgical History  Procedure Laterality Date  . Foot surgery    . Tumor removal      from rectum area 02/2013   Family History  Problem Relation Age of Onset  . Cancer Mother     pancreatic   History  Substance Use Topics  . Smoking status: Never Smoker   . Smokeless tobacco: Never Used  . Alcohol Use: No   OB History   Grav Para Term Preterm Abortions TAB SAB Ect Mult Living                 Review of Systems  Constitutional: Negative for fever.  Cardiovascular: Negative for chest pain.  Gastrointestinal: Positive for blood in stool and hematochezia. Negative for vomiting and abdominal pain.  Genitourinary: Negative for dysuria and vaginal bleeding.  Neurological: Positive for dizziness and weakness.  All other systems reviewed and are negative.    Allergies  Percocet  Home Medications   Current Outpatient Rx  Name  Route  Sig  Dispense  Refill  . acetaminophen (TYLENOL) 500 MG tablet   Oral   Take 500 mg by  mouth every 6 (six) hours as needed for pain.         Marland Kitchen aspirin EC 81 MG tablet   Oral   Take 81 mg by mouth daily.         Marland Kitchen docusate sodium (COLACE) 100 MG capsule   Oral   Take 100 mg by mouth 2 (two) times daily.         Marland Kitchen HYDROcodone-acetaminophen (NORCO/VICODIN) 5-325 MG per tablet   Oral   Take 1 tablet by mouth every 6 (six) hours as needed for pain.         Marland Kitchen ibuprofen (ADVIL,MOTRIN) 200 MG tablet   Oral   Take 200 mg by mouth every 6 (six) hours as needed for pain.         Marland Kitchen ondansetron (ZOFRAN) 4 MG tablet   Oral   Take 4 mg by mouth every 8 (eight) hours as needed for nausea.         . polyethylene glycol (MIRALAX / GLYCOLAX) packet   Oral   Take 17 g by mouth daily as needed (constipation).         . pregabalin (LYRICA) 75 MG capsule   Oral   Take 75 mg by mouth every 12 (twelve) hours as needed (as needed for pain).  BP 138/83  Pulse 107  Temp(Src) 98.7 F (37.1 C) (Oral)  Resp 16  SpO2 100% Physical Exam CONSTITUTIONAL: Well developed/well nourished HEAD: Normocephalic/atraumatic EYES: EOMI/PERRL ENMT: Mucous membranes moist NECK: supple no meningeal signs SPINE:entire spine nontender CV: S1/S2 noted, no murmurs/rubs/gallops noted LUNGS: Lungs are clear to auscultation bilaterally, no apparent distress ABDOMEN: soft, nontender, no rebound or guarding GU:no cva tenderness. No gross blood noted on external exam Rectal - blood surrounding rectum. There are no tears/lacerations noted NEURO: Pt is awake/alert, moves all extremitiesx4 EXTREMITIES: pulses normal, full ROM SKIN: pt appears pales PSYCH: no abnormalities of mood noted  ED Course   Procedures   Labs Reviewed  CBC WITH DIFFERENTIAL - Abnormal; Notable for the following:    RBC 3.14 (*)    Hemoglobin 6.6 (*)    HCT 22.7 (*)    MCV 72.3 (*)    MCH 21.0 (*)    MCHC 29.1 (*)    RDW 18.3 (*)    Platelets 417 (*)    All other components within normal limits   BASIC METABOLIC PANEL  POCT PREGNANCY, URINE  TYPE AND SCREEN  ABO/RH  PREPARE RBC (CROSSMATCH)   1:22 PM Pt with h/o rectal tumor, she underwent transanal tumor resection on 8/15 at [facility] She now presents with increased rectal bleeding She is anemic (last HGB 8.8 in [facility] records) and is now at 6.6 She has agreed to receive blood products (pt is pale, anemic, recent active bleeding and is feeling weak) [facility] surgeon with surgical oncology  1:40 PM Spoke to dr kim with surgery He accepts patient in transfer (pt with recent surgery at [facility], would prefer transfer back to that facility) He agrees with blood transfusion Will proceed with ER to ER transfer Spoke to dr Massie Maroon with the emergency department.  He is aware and accepts patient as well  MDM  Nursing notes including past medical history and social history reviewed and considered in documentation Labs/vital reviewed and considered Previous records reviewed and considered - [facility] records reviewed   Joya Gaskins, MD 03/11/13 1343

## 2013-03-11 NOTE — ED Notes (Addendum)
Pt reports 8 weeks ago had surgery to remove large tumor via pelvic area of stomach, tumors located between colon and back and tumors in rectum. 1 week ago had another surgery via rectum to remove tumors. Reports some slight bleeding since then, but today woke up and had rectal bleeding with blood clots. Reports some dizziness since yesterday.  Pain in lower back 4/10 described as discomfort.

## 2013-03-15 LAB — TYPE AND SCREEN
ABO/RH(D): A POS
Antibody Screen: NEGATIVE

## 2013-04-26 ENCOUNTER — Encounter (HOSPITAL_COMMUNITY): Payer: Self-pay | Admitting: Emergency Medicine

## 2013-04-26 ENCOUNTER — Emergency Department (HOSPITAL_COMMUNITY)
Admission: EM | Admit: 2013-04-26 | Discharge: 2013-04-26 | Disposition: A | Discharge status: Home / Self Care | Payer: Self-pay | Attending: Emergency Medicine | Admitting: Emergency Medicine

## 2013-04-26 DIAGNOSIS — Z8719 Personal history of other diseases of the digestive system: Secondary | ICD-10-CM | POA: Insufficient documentation

## 2013-04-26 DIAGNOSIS — Z7982 Long term (current) use of aspirin: Secondary | ICD-10-CM | POA: Insufficient documentation

## 2013-04-26 DIAGNOSIS — H10212 Acute toxic conjunctivitis, left eye: Secondary | ICD-10-CM | POA: Diagnosis present

## 2013-04-26 DIAGNOSIS — H10219 Acute toxic conjunctivitis, unspecified eye: Secondary | ICD-10-CM | POA: Insufficient documentation

## 2013-04-26 DIAGNOSIS — Z79899 Other long term (current) drug therapy: Secondary | ICD-10-CM | POA: Insufficient documentation

## 2013-04-26 DIAGNOSIS — H5789 Other specified disorders of eye and adnexa: Secondary | ICD-10-CM | POA: Insufficient documentation

## 2013-04-26 MED ORDER — FLUORESCEIN SODIUM 1 MG OP STRP
1.0000 | ORAL_STRIP | Freq: Once | OPHTHALMIC | Status: AC
  Administered 2013-04-26: 1 via OPHTHALMIC
  Filled 2013-04-26: qty 1

## 2013-04-26 NOTE — ED Notes (Signed)
Bed: WA20 Expected date:  Expected time:  Means of arrival:  Comments: Closed for right now

## 2013-04-26 NOTE — ED Notes (Signed)
Pt receiving Morgan Lens irrigation to left eye, per MD order

## 2013-04-26 NOTE — ED Notes (Signed)
Morgan lens discontinued per pt request

## 2013-04-26 NOTE — ED Notes (Signed)
Pt reports she accidentally put skin tag remover in her left eye thinking it was her eye drops. Upon arrival pt reported eye pain and difficulty opening eye. rn irrigated pts eye with 30 ml normal saline. Pt reports irritation has decreased and pt now able to open eye. Pain 3/10.

## 2013-04-26 NOTE — ED Provider Notes (Signed)
CSN: [id number]     Arrival date & time 04/26/13  1849 History   First MD Initiated Contact with Patient 04/26/13 1928     Chief Complaint  Patient presents with  . Eye Pain   (Consider location/radiation/quality/duration/timing/severity/associated sxs/prior Treatment) Patient is a 47 y.o. female presenting with eye pain. The history is provided by the patient.  Eye Pain This is a new problem. The current episode started less than 1 hour ago. The problem occurs constantly. The problem has been gradually improving. Pertinent negatives include no chest pain, no abdominal pain, no headaches and no shortness of breath. Nothing aggravates the symptoms. Nothing relieves the symptoms. Treatments tried: irrigation. The treatment provided significant relief.    Past Medical History  Diagnosis Date  . Tumor of rectum    Past Surgical History  Procedure Laterality Date  . Foot surgery    . Tumor removal      from rectum area 02/2013   Family History  Problem Relation Age of Onset  . Cancer Mother     pancreatic   History  Substance Use Topics  . Smoking status: Never Smoker   . Smokeless tobacco: Never Used  . Alcohol Use: No   OB History   Grav Para Term Preterm Abortions TAB SAB Ect Mult Living                 Review of Systems  Constitutional: Negative for fever and fatigue.  HENT: Negative for congestion and drooling.   Eyes: Positive for pain and redness.  Respiratory: Negative for cough and shortness of breath.   Cardiovascular: Negative for chest pain.  Gastrointestinal: Negative for nausea, vomiting, abdominal pain and diarrhea.  Genitourinary: Negative for dysuria and hematuria.  Musculoskeletal: Negative for back pain, gait problem and neck pain.  Skin: Negative for color change.  Neurological: Negative for dizziness and headaches.  Hematological: Negative for adenopathy.  Psychiatric/Behavioral: Negative for behavioral problems.  All other systems reviewed and are  negative.    Allergies  Hydrocodone and Percocet  Home Medications   Current Outpatient Rx  Name  Route  Sig  Dispense  Refill  . acetaminophen (TYLENOL) 500 MG tablet   Oral   Take 500 mg by mouth every 6 (six) hours as needed for pain.         Marland Kitchen aspirin EC 81 MG tablet   Oral   Take 81 mg by mouth daily.         Marland Kitchen docusate sodium (COLACE) 100 MG capsule   Oral   Take 100 mg by mouth 2 (two) times daily.         Marland Kitchen ibuprofen (ADVIL,MOTRIN) 200 MG tablet   Oral   Take 200 mg by mouth every 6 (six) hours as needed for pain.         Marland Kitchen ondansetron (ZOFRAN) 4 MG tablet   Oral   Take 4 mg by mouth every 8 (eight) hours as needed for nausea.         . polyethylene glycol (MIRALAX / GLYCOLAX) packet   Oral   Take 17 g by mouth daily as needed (constipation).         . pregabalin (LYRICA) 75 MG capsule   Oral   Take 75 mg by mouth every 12 (twelve) hours as needed (as needed for pain).          BP 140/81  Pulse 93  Temp(Src) 99 F (37.2 C) (Oral)  Resp 20  Ht 5'  5" (1.651 m)  Wt 140 lb (63.504 kg)  BMI 23.3 kg/m2  SpO2 100% Physical Exam  Nursing note and vitals reviewed. Constitutional: She is oriented to person, place, and time. She appears well-developed and well-nourished.  HENT:  Head: Normocephalic.  Mouth/Throat: Oropharynx is clear and moist. No oropharyngeal exudate.  Conjunctival injection of the left eye. EOMI. PERRL.   Vision is 20/70 in left eye, 20/20 in right eye.   Eyes: Conjunctivae and EOM are normal. Pupils are equal, round, and reactive to light.  Neck: Normal range of motion. Neck supple.  Cardiovascular: Normal rate, regular rhythm, normal heart sounds and intact distal pulses.  Exam reveals no gallop and no friction rub.   No murmur heard. Pulmonary/Chest: Effort normal and breath sounds normal. No respiratory distress. She has no wheezes.  Abdominal: Soft. Bowel sounds are normal. There is no tenderness. There is no rebound  and no guarding.  Musculoskeletal: Normal range of motion. She exhibits no edema and no tenderness.  Neurological: She is alert and oriented to person, place, and time.  Skin: Skin is warm and dry.  Psychiatric: She has a normal mood and affect. Her behavior is normal.    ED Course  Procedures (including critical care time) Labs Review Labs Reviewed - No data to display Imaging Review No results found.  MDM   1. Chemical conjunctivitis of left eye    7:45 PM 47 y.o. female who presents with left eye pain after accidentally placing a drop of skin tag removal liquid in her left eye approximately one hour ago. The patient flushed her eye out at home and her eye has been flushed here as well w/ 30cc's NS initially. She notes mild blurry vision and mild pain at this time. She is afebrile and her vital signs are unremarkable here. Will check vision, stain the eye, check pH.   Vision as noted above in exam, no corneal abrasions upon staining, pH is normal. Will irrigate w/ morgan lens d/t pt's continued mild burning sensation. Pt only tolerated approx 100-200cc's irrigation w/ morgan lens. I performed a slit lamp exam which was unremarkable.   9:34 PM:  I have discussed the diagnosis/risks/treatment options with the patient and family and believe the pt to be eligible for discharge home to follow-up with Dr. Karleen Hampshire tomorrow. We also discussed returning to the ED immediately if new or worsening sx occur. We discussed the sx which are most concerning (e.g., worsening pain, worsening vision) that necessitate immediate return. Any new prescriptions provided to the patient are listed below.  New Prescriptions   No medications on file     Junius Argyle, MD 04/27/13 [phone]

## 2013-05-25 ENCOUNTER — Other Ambulatory Visit: Payer: Self-pay

## 2014-04-01 ENCOUNTER — Encounter (HOSPITAL_COMMUNITY): Payer: Self-pay | Admitting: Emergency Medicine

## 2014-04-01 ENCOUNTER — Emergency Department (HOSPITAL_COMMUNITY)
Admission: EM | Admit: 2014-04-01 | Discharge: 2014-04-01 | Disposition: A | Discharge status: Home / Self Care | Payer: Medicaid Other | Attending: Emergency Medicine | Admitting: Emergency Medicine

## 2014-04-01 DIAGNOSIS — R51 Headache: Secondary | ICD-10-CM | POA: Insufficient documentation

## 2014-04-01 DIAGNOSIS — Z79899 Other long term (current) drug therapy: Secondary | ICD-10-CM | POA: Insufficient documentation

## 2014-04-01 DIAGNOSIS — D509 Iron deficiency anemia, unspecified: Secondary | ICD-10-CM | POA: Insufficient documentation

## 2014-04-01 DIAGNOSIS — Z85038 Personal history of other malignant neoplasm of large intestine: Secondary | ICD-10-CM | POA: Insufficient documentation

## 2014-04-01 DIAGNOSIS — Z791 Long term (current) use of non-steroidal anti-inflammatories (NSAID): Secondary | ICD-10-CM | POA: Insufficient documentation

## 2014-04-01 DIAGNOSIS — R531 Weakness: Secondary | ICD-10-CM

## 2014-04-01 DIAGNOSIS — R5383 Other fatigue: Secondary | ICD-10-CM

## 2014-04-01 DIAGNOSIS — Z8719 Personal history of other diseases of the digestive system: Secondary | ICD-10-CM | POA: Diagnosis not present

## 2014-04-01 DIAGNOSIS — R5381 Other malaise: Secondary | ICD-10-CM | POA: Insufficient documentation

## 2014-04-01 HISTORY — DX: Malignant (primary) neoplasm, unspecified: C80.1

## 2014-04-01 LAB — COMPREHENSIVE METABOLIC PANEL
ALBUMIN: 3.4 g/dL — AB (ref 3.5–5.2)
ALT: 19 U/L (ref 0–35)
ANION GAP: 11 (ref 5–15)
AST: 31 U/L (ref 0–37)
Alkaline Phosphatase: 216 U/L — ABNORMAL HIGH (ref 39–117)
BUN: 15 mg/dL (ref 6–23)
CO2: 27 mEq/L (ref 19–32)
CREATININE: 0.86 mg/dL (ref 0.50–1.10)
Calcium: 8.9 mg/dL (ref 8.4–10.5)
Chloride: 101 mEq/L (ref 96–112)
GFR calc Af Amer: >90 mL/min (ref >90)
GFR calc non Af Amer: 79 mL/min — ABNORMAL LOW (ref >90)
Glucose, Bld: 101 mg/dL — ABNORMAL HIGH (ref 70–99)
POTASSIUM: 4 meq/L (ref 3.7–5.3)
Sodium: 139 mEq/L (ref 137–147)
TOTAL PROTEIN: 7.7 g/dL (ref 6.0–8.3)
Total Bilirubin: 0.3 mg/dL (ref 0.3–1.2)

## 2014-04-01 LAB — CBC WITH DIFFERENTIAL/PLATELET
BASOS ABS: 0 10*3/uL (ref 0.0–0.1)
Basophils Relative: 0 % (ref 0–1)
Eosinophils Absolute: 0.1 10*3/uL (ref 0.0–0.7)
Eosinophils Relative: 1 % (ref 0–5)
HCT: 28.3 % — ABNORMAL LOW (ref 36.0–46.0)
Hemoglobin: 8.4 g/dL — ABNORMAL LOW (ref 12.0–15.0)
LYMPHS ABS: 1.4 10*3/uL (ref 0.7–4.0)
Lymphocytes Relative: 19 % (ref 12–46)
MCH: 21.3 pg — ABNORMAL LOW (ref 26.0–34.0)
MCHC: 29.7 g/dL — ABNORMAL LOW (ref 30.0–36.0)
MCV: 71.6 fL — AB (ref 78.0–100.0)
MONOS PCT: 9 % (ref 3–12)
Monocytes Absolute: 0.7 10*3/uL (ref 0.1–1.0)
NEUTROS PCT: 71 % (ref 43–77)
Neutro Abs: 5.2 10*3/uL (ref 1.7–7.7)
PLATELETS: 451 10*3/uL — AB (ref 150–400)
RBC: 3.95 MIL/uL (ref 3.87–5.11)
RDW: 18.1 % — AB (ref 11.5–15.5)
WBC: 7.4 10*3/uL (ref 4.0–10.5)

## 2014-04-01 MED ORDER — IBUPROFEN 200 MG PO TABS
400.0000 mg | ORAL_TABLET | Freq: Once | ORAL | Status: AC
  Administered 2014-04-01: 400 mg via ORAL
  Filled 2014-04-01: qty 2

## 2014-04-01 NOTE — ED Notes (Signed)
The pt is c/o a severe headache for 4 hours with abd cramps. She last had a headache 2 weeks ago.  No nv.  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  She gets a cancer med injectiion once a month and she has had radiation. She has a yellowish tint to her skin

## 2014-04-01 NOTE — ED Notes (Signed)
Attempted to triage.  She in the br

## 2014-04-01 NOTE — ED Provider Notes (Signed)
CSN: [id number]     Arrival date & time 04/01/14  0201 History   First MD Initiated Contact with Patient 04/01/14 [phone]     Chief Complaint  Patient presents with  . Headache     (Consider location/radiation/quality/duration/timing/severity/associated sxs/prior Treatment) HPI  Sheila Perkins is a 48 y.o. female who presents for evaluation of weakness and feeling like she is "pale". She ran out of her tablets 2 weeks ago. She has trouble taking iron because of constipation. She denies fever, chills, nausea, vomiting, cough, shortness of breath, or chest pain. She saw her oncologist 3 weeks ago and was told. Her blood count was low, so she should re-start taking her iron tablets. There are no other known modifying factors   Past Medical History  Diagnosis Date  . Tumor of rectum   . Cancer    Past Surgical History  Procedure Laterality Date  . Foot surgery    . Tumor removal      from rectum area 02/2013   Family History  Problem Relation Age of Onset  . Cancer Mother     pancreatic   History  Substance Use Topics  . Smoking status: Never Smoker   . Smokeless tobacco: Never Used  . Alcohol Use: No   OB History   Grav Para Term Preterm Abortions TAB SAB Ect Mult Living                 Review of Systems  All other systems reviewed and are negative.     Allergies  Hydrocodone and Percocet  Home Medications   Prior to Admission medications   Medication Sig Start Date End Date Taking? Authorizing Provider  amphetamine-dextroamphetamine (ADDERALL) 15 MG tablet Take 15 mg by mouth daily.   Yes Historical Provider, MD  ferrous sulfate 325 (65 FE) MG tablet Take 325 mg by mouth daily with breakfast.   Yes Historical Provider, MD  ibuprofen (ADVIL,MOTRIN) 200 MG tablet Take 400 mg by mouth daily.   Yes Historical Provider, MD  octreotide (SANDOSTATIN LAR) 20 MG injection Inject 20 mg into the muscle every 28 (twenty-eight) days.   Yes Historical Provider, MD   BP 138/79   Pulse 65  Resp 18  Ht 5\' 5"  (1.651 m)  Wt 146 lb (66.225 kg)  BMI 24.30 kg/m2  SpO2 99% Physical Exam  Nursing note and vitals reviewed. Constitutional: She is oriented to person, place, and time. She appears well-developed and well-nourished.  HENT:  Head: Normocephalic and atraumatic.  Eyes: Conjunctivae and EOM are normal. Pupils are equal, round, and reactive to light.  Neck: Normal range of motion and phonation normal. Neck supple.  Cardiovascular: Normal rate.   Pulmonary/Chest: Effort normal. She exhibits no tenderness.  Musculoskeletal: Normal range of motion.  Neurological: She is alert and oriented to person, place, and time. She exhibits normal muscle tone.  Skin: Skin is warm and dry.  Psychiatric: She has a normal mood and affect. Her behavior is normal. Judgment and thought content normal.    ED Course  Procedures (including critical care time)  Outside records indicate he 8.4 on 03/08/2014  Findings discussed with patient, all questions answered. She does not prefer to have IV fluids, at this time.   Labs Review Labs Reviewed  CBC WITH DIFFERENTIAL - Abnormal; Notable for the following:    Hemoglobin 8.4 (*)    HCT 28.3 (*)    MCV 71.6 (*)    MCH 21.3 (*)    MCHC 29.7 (*)  RDW 18.1 (*)    Platelets 451 (*)    All other components within normal limits  COMPREHENSIVE METABOLIC PANEL - Abnormal; Notable for the following:    Glucose, Bld 101 (*)    Albumin 3.4 (*)    Alkaline Phosphatase 216 (*)    GFR calc non Af Amer 79 (*)    All other components within normal limits    Imaging Review No results found.   EKG Interpretation None      MDM   Final diagnoses:  Weak  Iron deficiency anemia   Nursing Notes Reviewed/ Care Coordinated Applicable Imaging Reviewed Interpretation of Laboratory Data incorporated into ED treatment  The patient appears reasonably screened and/or stabilized for discharge and I doubt any other medical condition or  other [facility] requiring further screening, evaluation, or treatment in the ED at this time prior to discharge.  Plan: Home Medications- Iron and Miralax BID; Home Treatments- rest; return here if the recommended treatment, does not improve the symptoms; Recommended follow up- PCP 1 week   Richarda Blade, MD 04/01/14 [phone]

## 2014-04-01 NOTE — Discharge Instructions (Signed)
Get plenty of rest, and drink a lot of fluids. Use MiraLax twice a day to have soft bowel movements. Take 2 iron tablets each day to build up your iron stores.   Iron Deficiency Anemia Anemia is a condition in which there are less red blood cells or hemoglobin in the blood than normal. Hemoglobin is the part of red blood cells that carries oxygen. Iron deficiency anemia is anemia caused by too little iron. It is the most common type of anemia. It may leave you tired and short of breath. CAUSES   Lack of iron in the diet.  Poor absorption of iron, as seen with intestinal disorders.  Intestinal bleeding.  Heavy periods. SIGNS AND SYMPTOMS  Mild anemia may not be noticeable. Symptoms may include:  Fatigue.  Headache.  Pale skin.  Weakness.  Tiredness.  Shortness of breath.  Dizziness.  Cold hands and feet.  Fast or irregular heartbeat. DIAGNOSIS  Diagnosis requires a thorough evaluation and physical exam by your health care provider. Blood tests are generally used to confirm iron deficiency anemia. Additional tests may be done to find the underlying cause of your anemia. These may include:  Testing for blood in the stool (fecal occult blood test).  A procedure to see inside the colon and rectum (colonoscopy).  A procedure to see inside the esophagus and stomach (endoscopy). TREATMENT  Iron deficiency anemia is treated by correcting the cause of the deficiency. Treatment may involve:  Adding iron-rich foods to your diet.  Taking iron supplements. Pregnant or breastfeeding women need to take extra iron because their normal diet usually does not provide the required amount.  Taking vitamins. Vitamin C improves the absorption of iron. Your health care provider may recommend that you take your iron tablets with a glass of orange juice or vitamin C supplement.  Medicines to make heavy menstrual flow lighter.  Surgery. HOME CARE INSTRUCTIONS   Take iron as directed by  your health care provider.  If you cannot tolerate taking iron supplements by mouth, talk to your health care provider about taking them through a vein (intravenously) or an injection into a muscle.  For the best iron absorption, iron supplements should be taken on an empty stomach. If you cannot tolerate them on an empty stomach, you may need to take them with food.  Do not drink milk or take antacids at the same time as your iron supplements. Milk and antacids may interfere with the absorption of iron.  Iron supplements can cause constipation. Make sure to include fiber in your diet to prevent constipation. A stool softener may also be recommended.  Take vitamins as directed by your health care provider.  Eat a diet rich in iron. Foods high in iron include liver, lean beef, whole-grain bread, eggs, dried fruit, and dark green leafy vegetables. SEEK IMMEDIATE MEDICAL CARE IF:   You faint. If this happens, do not drive. Call your local emergency services (911 in [place] if no other help is available.  You have chest pain.  You feel nauseous or vomit.  You have severe or increased shortness of breath with activity.  You feel weak.  You have a rapid heartbeat.  You have unexplained sweating.  You become light-headed when getting up from a chair or bed. MAKE SURE YOU:   Understand these instructions.  Will watch your condition.  Will get help right away if you are not doing well or get worse. Document Released: 07/03/2000 Document Revised: 07/11/2013 Document Reviewed: 03/13/2013 ExitCare  Patient Information 2015 [place]. This information is not intended to replace advice given to you by your health care provider. Make sure you discuss any questions you have with your health care provider.

## 2014-04-06 DIAGNOSIS — D509 Iron deficiency anemia, unspecified: Secondary | ICD-10-CM | POA: Insufficient documentation

## 2014-05-08 DIAGNOSIS — M792 Neuralgia and neuritis, unspecified: Secondary | ICD-10-CM | POA: Insufficient documentation

## 2014-09-12 ENCOUNTER — Ambulatory Visit: Payer: Medicaid Other | Admitting: Sports Medicine

## 2014-09-25 ENCOUNTER — Ambulatory Visit
Admission: RE | Admit: 2014-09-25 | Discharge: 2014-09-25 | Disposition: A | Discharge status: Home / Self Care | Payer: Medicaid Other | Source: Ambulatory Visit | Attending: Sports Medicine | Admitting: Sports Medicine

## 2014-09-25 ENCOUNTER — Ambulatory Visit (INDEPENDENT_AMBULATORY_CARE_PROVIDER_SITE_OTHER): Payer: Medicaid Other | Admitting: Sports Medicine

## 2014-09-25 ENCOUNTER — Encounter: Payer: Self-pay | Admitting: Sports Medicine

## 2014-09-25 VITALS — BP 143/87 | HR 56 | Ht 66.0 in | Wt 126.0 lb

## 2014-09-25 DIAGNOSIS — M25572 Pain in left ankle and joints of left foot: Secondary | ICD-10-CM

## 2014-09-25 NOTE — Progress Notes (Signed)
Subjective:     Patient ID: Sheila Perkins, female   DOB: March 19, 1966, 49 y.o.   MRN: [id number]  HPI Sheila Perkins is a 49y.o. Female here today for a 3-week h/o left ankle swelling, pain, and tenderness. Pt reports that the swelling began spontaneously and does not recall any trauma or injuries. The swelling is localized mostly to her lateral malleolus, but spreads to the lateral portion of the dorsal surface and up to her mid-calf. She notes that the swelling decreases with raising her feet, but returns within 30 minutes of standing. She reports pain along the lateral malleolus, lateral dorsal foot, and to the mid-calf. She notes that the pain is only with palpation and describes it as an initially sharp pain that then becomes dull. She also reports numbness, but not tingling. Pt denies that the area has ever been red or warm. Pt had a doppler US done last week to r/o DVTs, which shoed no DVTs bilaterally.  Pt's symptoms have not limited her daily activities, but does report that after walking for a period of time, she notices that her left leg begins to tire and drag. She reports that her left thigh continues to have numbness and has not improved since she was last seen 2 years ago. Pt reports that her lower back discomfort continues since then as well.   Since her last visit, pt has been diagnosed wit Stage 4 colorectal cancer, with mets to her liver. She has had 4 surgeries and chemotherapy, and reports that she is feeling great. She is currently taking weekly chemotherapy injections and daily chemotherapy pills. She reports that her oncologist was concerned that her left ankle swelling may be a side-effect of her new chemotherapy regimen but before tapering her off of this, they want to rule-out an MSK etiologies.   Review of Systems     Objective:   Physical Exam Filed Vitals:   09/25/14 1040  BP: 143/87  Pulse: 56  General: Pleasant female in NAD  Left Ankle:   Observation: moderate  edema over lateral malleolus, extending to base of fifth digit and mid-way along fibula, no erythema or bruising, well perfused  Palpation: tenderness to palpation over lateral malleolus and along peroneal tendon attachment sites, no tenderness over medial malleolus, non-pitting soft   tissue swelling, no effusion, no defects                     Neuro: decreased sensation to fine touch, 1+ achilles and patellar reflexes                     Vascular: unable to palpate dorsalis pedis compared with right   ROM: normal plantar flexion and dorsiflexion, but pain elicited with dorsiflexion, normal pronation, normal supination  Strength: Normal 5/5 strength with plantar flexion and dorsiflexion, pronation and supination      Assessment:     Sheila Perkins is a 49y.o. Female here today for a 3-week h/o left ankle swelling, pain, and tenderness most c/w peroneal tendonitis. With negative DVT on doppler and tenderness to palpation along peroneal tendon, etiology most likely MSK. However, since pt is currently on chemotherapy and results of this visit may change her treatment course, we will obtain further imaging to confirm this.    Plan:     1. X-ray and MRI left ankle to check to confirm peroneal tendinitis. Will f/u with MRI results via telephone. 2. Provided pt with full ankle sleeve to  decrease swelling to be worn only during the day. If pt notices swelling above and below the sleeve, asked her to d'c use. 3. Follow-up in 3 weeks. We will reevaluate her left ankle and also evaluate some low back pain she is having.  Of note, the patient brought in some disability paperwork today. I would prefer to provide a letter to her attorney instead. She will check with her attorney to see if this is possible. In my opinion this patient is not able to work in any sort of capacity at this point in time due to undergoing treatment for  stage IV colorectal cancer.     Patient was seen and evaluated with the medical student. I agree with the above plan of care.

## 2014-10-03 ENCOUNTER — Encounter: Payer: Self-pay | Admitting: Sports Medicine

## 2014-10-05 ENCOUNTER — Ambulatory Visit
Admission: RE | Admit: 2014-10-05 | Discharge: 2014-10-05 | Disposition: A | Discharge status: Home / Self Care | Payer: Medicaid Other | Source: Ambulatory Visit | Attending: Sports Medicine | Admitting: Sports Medicine

## 2014-10-05 DIAGNOSIS — M25572 Pain in left ankle and joints of left foot: Secondary | ICD-10-CM

## 2014-10-10 ENCOUNTER — Ambulatory Visit: Payer: Medicaid Other | Admitting: Sports Medicine

## 2014-10-11 ENCOUNTER — Ambulatory Visit (INDEPENDENT_AMBULATORY_CARE_PROVIDER_SITE_OTHER): Payer: Medicaid Other | Admitting: Sports Medicine

## 2014-10-11 ENCOUNTER — Encounter: Payer: Self-pay | Admitting: Sports Medicine

## 2014-10-11 VITALS — BP 135/92 | HR 71 | Ht 66.0 in | Wt 126.0 lb

## 2014-10-11 DIAGNOSIS — M25572 Pain in left ankle and joints of left foot: Secondary | ICD-10-CM

## 2014-10-11 NOTE — Progress Notes (Signed)
Patient ID: Sheila Perkins, female   DOB: 01/11/1966, 49 y.o.   MRN: [id number]  Sheila Perkins comes in today to discuss MRI findings of her left ankle. 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 She also has subcutaneous edema that is eccentric along the lateral side of the ankle. The radiologist comments that he does not believe the edema is due to the interosseous geode. Patient tells me that the majority of her swelling is along the lateral ankle but she has begun to get some medial swelling as well. Swelling resolves overnight with elevation. It also improves with compression and with wearing high heels. She is also experiencing lateral ankle pain. As stated in my previous notes this patient is undergoing chemotherapy for stage IV colorectal cancer. Her oncologist is wanting to make sure that the swelling in her ankle is not due to any sort of orthopedic issue before changing her chemotherapy regimen. Although the radiologist does not believe the soft tissue swelling in her ankle is a result of the interosseous geode, I would like to refer the patient to Dr. Meridee Score for his input. If Dr. Sharol Given feels like her symptoms are from the bone cyst then I would defer further treatment of this issue to him.

## 2015-06-05 ENCOUNTER — Emergency Department (HOSPITAL_COMMUNITY): Payer: Medicare Other

## 2015-06-05 ENCOUNTER — Encounter (HOSPITAL_COMMUNITY): Payer: Self-pay

## 2015-06-05 ENCOUNTER — Emergency Department (HOSPITAL_COMMUNITY)
Admission: EM | Admit: 2015-06-05 | Discharge: 2015-06-05 | Discharge status: Against Medical Advice | Payer: Medicaid Other | Attending: Emergency Medicine | Admitting: Emergency Medicine

## 2015-06-05 ENCOUNTER — Emergency Department (HOSPITAL_COMMUNITY)
Admission: EM | Admit: 2015-06-05 | Discharge: 2015-06-05 | Disposition: A | Discharge status: Home / Self Care | Payer: Medicare Other | Attending: Physician Assistant | Admitting: Physician Assistant

## 2015-06-05 DIAGNOSIS — R51 Headache: Secondary | ICD-10-CM

## 2015-06-05 DIAGNOSIS — C189 Malignant neoplasm of colon, unspecified: Secondary | ICD-10-CM | POA: Diagnosis not present

## 2015-06-05 DIAGNOSIS — G43909 Migraine, unspecified, not intractable, without status migrainosus: Secondary | ICD-10-CM | POA: Diagnosis not present

## 2015-06-05 DIAGNOSIS — R200 Anesthesia of skin: Secondary | ICD-10-CM | POA: Insufficient documentation

## 2015-06-05 DIAGNOSIS — R519 Headache, unspecified: Secondary | ICD-10-CM

## 2015-06-05 DIAGNOSIS — C787 Secondary malignant neoplasm of liver and intrahepatic bile duct: Secondary | ICD-10-CM | POA: Diagnosis not present

## 2015-06-05 DIAGNOSIS — Z79899 Other long term (current) drug therapy: Secondary | ICD-10-CM | POA: Diagnosis not present

## 2015-06-05 DIAGNOSIS — Z9889 Other specified postprocedural states: Secondary | ICD-10-CM | POA: Diagnosis not present

## 2015-06-05 DIAGNOSIS — Z7982 Long term (current) use of aspirin: Secondary | ICD-10-CM | POA: Insufficient documentation

## 2015-06-05 DIAGNOSIS — Z8505 Personal history of malignant neoplasm of liver: Secondary | ICD-10-CM | POA: Insufficient documentation

## 2015-06-05 DIAGNOSIS — Z85048 Personal history of other malignant neoplasm of rectum, rectosigmoid junction, and anus: Secondary | ICD-10-CM | POA: Diagnosis not present

## 2015-06-05 HISTORY — DX: Malignant neoplasm of liver, not specified as primary or secondary: C22.9

## 2015-06-05 LAB — CBC WITH DIFFERENTIAL/PLATELET
BASOS ABS: 0 10*3/uL (ref 0.0–0.1)
BASOS PCT: 0 %
EOS ABS: 0.1 10*3/uL (ref 0.0–0.7)
EOS PCT: 1 %
HCT: 35.4 % — ABNORMAL LOW (ref 36.0–46.0)
Hemoglobin: 11.2 g/dL — ABNORMAL LOW (ref 12.0–15.0)
Lymphocytes Relative: 30 %
Lymphs Abs: 1.3 10*3/uL (ref 0.7–4.0)
MCH: 23.9 pg — ABNORMAL LOW (ref 26.0–34.0)
MCHC: 31.6 g/dL (ref 30.0–36.0)
MCV: 75.5 fL — ABNORMAL LOW (ref 78.0–100.0)
Monocytes Absolute: 0.3 10*3/uL (ref 0.1–1.0)
Monocytes Relative: 8 %
Neutro Abs: 2.6 10*3/uL (ref 1.7–7.7)
Neutrophils Relative %: 61 %
PLATELETS: 194 10*3/uL (ref 150–400)
RBC: 4.69 MIL/uL (ref 3.87–5.11)
RDW: 13 % (ref 11.5–15.5)
WBC: 4.2 10*3/uL (ref 4.0–10.5)

## 2015-06-05 LAB — COMPREHENSIVE METABOLIC PANEL
ALT: 18 U/L (ref 14–54)
AST: 28 U/L (ref 15–41)
Albumin: 4.1 g/dL (ref 3.5–5.0)
Alkaline Phosphatase: 108 U/L (ref 38–126)
Anion gap: 6 (ref 5–15)
BILIRUBIN TOTAL: 0.2 mg/dL — AB (ref 0.3–1.2)
BUN: 12 mg/dL (ref 6–20)
CO2: 29 mmol/L (ref 22–32)
CREATININE: 0.8 mg/dL (ref 0.44–1.00)
Calcium: 9.2 mg/dL (ref 8.9–10.3)
Chloride: 108 mmol/L (ref 101–111)
GFR calc Af Amer: >60 mL/min (ref >60)
Glucose, Bld: 100 mg/dL — ABNORMAL HIGH (ref 65–99)
POTASSIUM: 3.9 mmol/L (ref 3.5–5.1)
Sodium: 143 mmol/L (ref 135–145)
TOTAL PROTEIN: 7.6 g/dL (ref 6.5–8.1)

## 2015-06-05 LAB — HCG, SERUM, QUALITATIVE: PREG SERUM: NEGATIVE

## 2015-06-05 MED ORDER — PROCHLORPERAZINE EDISYLATE 5 MG/ML IJ SOLN
10.0000 mg | Freq: Once | INTRAMUSCULAR | Status: AC
  Administered 2015-06-05: 10 mg via INTRAVENOUS
  Filled 2015-06-05: qty 2

## 2015-06-05 MED ORDER — SODIUM CHLORIDE 0.9 % IV BOLUS (SEPSIS)
1000.0000 mL | Freq: Once | INTRAVENOUS | Status: AC
  Administered 2015-06-05: 1000 mL via INTRAVENOUS

## 2015-06-05 MED ORDER — DIPHENHYDRAMINE HCL 50 MG/ML IJ SOLN
25.0000 mg | Freq: Once | INTRAMUSCULAR | Status: AC
  Administered 2015-06-05: 25 mg via INTRAVENOUS
  Filled 2015-06-05: qty 1

## 2015-06-05 NOTE — Discharge Instructions (Signed)
You should follow up with your primary oncologist about your headaches.   General Headache Without Cause A headache is pain or discomfort felt around the head or neck area. There are many causes and types of headaches. In some cases, the cause may not be found.  HOME CARE  Managing Pain  Take over-the-counter and prescription medicines only as told by your doctor.  Lie down in a dark, quiet room when you have a headache.  If directed, apply ice to the head and neck area:  Put ice in a plastic bag.  Place a towel between your skin and the bag.  Leave the ice on for 20 minutes, 2-3 times per day.  Use a heating pad or hot shower to apply heat to the head and neck area as told by your doctor.  Keep lights dim if bright lights bother you or make your headaches worse. Eating and Drinking  Eat meals on a regular schedule.  Lessen how much alcohol you drink.  Lessen how much caffeine you drink, or stop drinking caffeine. General Instructions  Keep all follow-up visits as told by your doctor. This is important.  Keep a journal to find out if certain things bring on headaches. For example, write down:  What you eat and drink.  How much sleep you get.  Any change to your diet or medicines.  Relax by getting a massage or doing other relaxing activities.  Lessen stress.  Sit up straight. Do not tighten (tense) your muscles.  Do not use tobacco products. This includes cigarettes, chewing tobacco, or e-cigarettes. If you need help quitting, ask your doctor.  Exercise regularly as told by your doctor.  Get enough sleep. This often means 7-9 hours of sleep. GET HELP IF:  Your symptoms are not helped by medicine.  You have a headache that feels different than the other headaches.  You feel sick to your stomach (nauseous) or you throw up (vomit).  You have a fever. GET HELP RIGHT AWAY IF:   Your headache becomes really bad.  You keep throwing up.  You have a stiff  neck.  You have trouble seeing.  You have trouble speaking.  You have pain in the eye or ear.  Your muscles are weak or you lose muscle control.  You lose your balance or have trouble walking.  You feel like you will pass out (faint) or you pass out.  You have confusion.   This information is not intended to replace advice given to you by your health care provider. Make sure you discuss any questions you have with your health care provider.   Document Released: 04/14/2008 Document Revised: 03/27/2015 Document Reviewed: 10/29/2014 Elsevier Interactive Patient Education [employer].

## 2015-06-05 NOTE — ED Provider Notes (Addendum)
CSN: [id number]     Arrival date & time 06/05/15  1711 History   First MD Initiated Contact with Patient 06/05/15 1724     Chief Complaint  Patient presents with  . Headache  . Leg Swelling  . Numbness     (Consider location/radiation/quality/duration/timing/severity/associated sxs/prior Treatment) HPI   Patient is a 49 year old female with stage III colon cancer presented today with headache for the last 3 months. Patient had intermittent headache for the last 3 months. Sometimes worse in the afternoon sometimes in the morning. Episodic. She states it's sharp pains in a viselike pain. She's has a history of migraines in the past. Patient is she's feels a little bit of numbness on her right cheek occasionally with these headaches.  Patient had no other neurologic changes. Patient on oral chemotherapy and followed at [facility] for her colon cancer. Patient has liver metastases.  Past Medical History  Diagnosis Date  . Tumor of rectum   . Cancer ([place])   . Liver cancer ([place])   . Liver cancer [facility])    Past Surgical History  Procedure Laterality Date  . Foot surgery    . Tumor removal      from rectum area 02/2013   Family History  Problem Relation Age of Onset  . Cancer Mother     pancreatic   Social History  Substance Use Topics  . Smoking status: Never Smoker   . Smokeless tobacco: Never Used  . Alcohol Use: No   OB History    No data available     Review of Systems  Constitutional: Negative for activity change and fatigue.  HENT: Negative for congestion, dental problem and ear pain.   Eyes: Negative for discharge.  Respiratory: Negative for cough and chest tightness.   Cardiovascular: Negative for chest pain.  Gastrointestinal: Negative for abdominal distention.  Genitourinary: Negative for dysuria and difficulty urinating.  Musculoskeletal: Negative for joint swelling.  Skin: Negative for rash.  Allergic/Immunologic: Negative for immunocompromised state.   Neurological: Positive for headaches. Negative for tremors, seizures, syncope, speech difficulty and weakness.  Psychiatric/Behavioral: Positive for decreased concentration. Negative for confusion and agitation.      Allergies  Codeine; Hydrocodone; Oxycodone; Percocet; and Tramadol  Home Medications   Prior to Admission medications   Medication Sig Start Date End Date Taking? Authorizing Provider  acetaminophen (TYLENOL) 500 MG tablet Take 500 mg by mouth every 8 (eight) hours as needed for headache.    Yes Historical Provider, MD  amphetamine-dextroamphetamine (ADDERALL) 20 MG tablet Take 20 mg by mouth 2 (two) times daily.   Yes Historical Provider, MD  aspirin EC 81 MG tablet Take 81 mg by mouth.   Yes Historical Provider, MD  aspirin-acetaminophen-caffeine (EXCEDRIN MIGRAINE) [phone] MG tablet Take 2 tablets by mouth every 6 (six) hours as needed for headache.   Yes Historical Provider, MD  Cholecalciferol 1000 UNITS tablet Take by mouth.   Yes Historical Provider, MD  cyclobenzaprine (FLEXERIL) 10 MG tablet Take 10 mg by mouth daily as needed for muscle spasms.  12/28/13  Yes Historical Provider, MD  everolimus (AFINITOR) 10 MG tablet Take 10 mg by mouth daily. 09/11/14  Yes Historical Provider, MD  Glutamine 500 MG CAPS Take by mouth.   Yes Historical Provider, MD  ibuprofen (ADVIL,MOTRIN) 200 MG tablet Take 400 mg by mouth daily as needed for headache.    Yes Historical Provider, MD  leuprolide (LUPRON DEPOT) 11.25 MG injection Inject 11.25 mg into the muscle every 3 (three)  months. 01/15/15 07/14/15 Yes Historical Provider, MD  LYRICA 50 MG capsule Take 50 mg by mouth 2 (two) times daily.  06/25/14  Yes Historical Provider, MD  NON FORMULARY Take 8 capsules by mouth daily. Juice plus vitamin gummies   Yes Historical Provider, MD  octreotide (SANDOSTATIN LAR) 20 MG injection Inject 20 mg into the muscle every 28 (twenty-eight) days.   Yes Historical Provider, MD  Omega-3 Fatty  Acids (FISH OIL) 1000 MG CAPS Take by mouth.   Yes Historical Provider, MD  OVER THE COUNTER MEDICATION Take 2 capsules by mouth daily. turmeric   Yes Historical Provider, MD  OVER THE COUNTER MEDICATION Take 2 Doses by mouth daily. Protein powder   Yes Historical Provider, MD  OVER THE COUNTER MEDICATION Take 1 Dose by mouth daily. Green algae powder   Yes Historical Provider, MD  OVER THE COUNTER MEDICATION Take 1 Dose by mouth daily. Coq  10 liquid   Yes Historical Provider, MD  polyethylene glycol (MIRALAX / GLYCOLAX) packet Take 17 g by mouth 3 (three) times daily as needed for mild constipation.  03/27/15  Yes Historical Provider, MD  Probiotic Product (PROBIOTIC PO) Take 1 capsule by mouth daily.   Yes Historical Provider, MD  promethazine (PHENERGAN) 25 MG tablet Take 25 mg by mouth every 6 (six) hours as needed for nausea.  06/02/15  Yes Historical Provider, MD   BP 142/101 mmHg  Pulse 89  Temp(Src) 98.2 F (36.8 C) (Oral)  Resp 19  Ht 5\' 5"  (1.651 m)  Wt 132 lb (59.875 kg)  BMI 21.97 kg/m2  SpO2 100% Physical Exam  Constitutional: She is oriented to person, place, and time. She appears well-developed and well-nourished.  HENT:  Head: Normocephalic and atraumatic.  Eyes: Conjunctivae are normal. Right eye exhibits no discharge.  Neck: Neck supple.  Cardiovascular: Normal rate, regular rhythm and normal heart sounds.   No murmur heard. Pulmonary/Chest: Effort normal and breath sounds normal. She has no wheezes. She has no rales.  Abdominal: Soft. She exhibits no distension. There is no tenderness.  Musculoskeletal: Normal range of motion. She exhibits no edema.  Neurological: She is oriented to person, place, and time. No cranial nerve deficit.  Cranial nerves II through XII are intact. No dysmetria.  Skin: Skin is warm and dry. No rash noted. She is not diaphoretic.  Psychiatric: She has a normal mood and affect. Her behavior is normal.  Nursing note and vitals  reviewed.   ED Course  Procedures (including critical care time) Labs Review Labs Reviewed  COMPREHENSIVE METABOLIC PANEL - Abnormal; Notable for the following:    Glucose, Bld 100 (*)    Total Bilirubin 0.2 (*)    All other components within normal limits  CBC WITH DIFFERENTIAL/PLATELET - Abnormal; Notable for the following:    Hemoglobin 11.2 (*)    HCT 35.4 (*)    MCV 75.5 (*)    MCH 23.9 (*)    All other components within normal limits  HCG, SERUM, QUALITATIVE    Imaging Review Ct Head Wo Contrast  06/05/2015  CLINICAL DATA:  Chronic headache. Right cheek numbness. Current history of stage III colon cancer. Initial encounter. EXAM: CT HEAD WITHOUT CONTRAST TECHNIQUE: Contiguous axial images were obtained from the base of the skull through the vertex without intravenous contrast. COMPARISON:  MRI of the brain performed 12/19/2008 FINDINGS: There is no evidence of acute infarction, mass lesion, or intra- or extra-axial hemorrhage on CT. The posterior fossa, including the cerebellum, brainstem  and fourth ventricle, is within normal limits. The third and lateral ventricles, and basal ganglia are unremarkable in appearance. The cerebral hemispheres are symmetric in appearance, with normal gray-white differentiation. No mass effect or midline shift is seen. There is no evidence of fracture; visualized osseous structures are unremarkable in appearance. The visualized portions of the orbits are within normal limits. The paranasal sinuses and mastoid air cells are well-aerated. No significant soft tissue abnormalities are seen. IMPRESSION: Unremarkable noncontrast CT of the head. Electronically Signed   By: Garald Balding M.D.   On: 06/05/2015 19:15   I have personally reviewed and evaluated these images and lab results as part of my medical decision-making.   EKG Interpretation None      MDM   Final diagnoses:  Nonintractable headache, unspecified chronicity pattern, unspecified  headache type    Patient is a 49 year old female with history of colon cancer presenting with headaches. Patient's age she's had headaches for the last couple months. She said that it feels like a vise on her head. She feels is related to stress. She also has a associated numbness to her right cheek occasionally. Patient has not tried anything for this home. Patient has a history of migraines. Patient says she does not want an MRI secondary to anxiety.  We will get CT to make sure that she does not have a bleed. However given the length times is going on, I doubt it. Given the waxing waning symptoms I also doubt a subarachnoid hemorrhage.   We will treat with migraine cocktail. Importantly we'll have patient follow up with her primary oncologist for potential of an MRI in her future.   8:54 PM Patient feels 100% back to normal. Very much improved. Smiling and chatting eating and ambulatory.  Kailany Dinunzio Julio Alm, MD 06/05/15 2054  Eldredge Veldhuizen Julio Alm, MD 06/05/15 2054

## 2015-06-05 NOTE — ED Notes (Signed)
Pt arrived to triage at 1630 ambulatory without difficulty and hyperventilating.    States she believes she is having a stroke.  C/o headache x 1 week.  Speech clear.  No neuro deficits noted. Pt was informed of wait to be triaged.  Pt no longer hyperventilating shortly after arrival.  NAD.  Pt was seen by registration staff walking out with family member.  Pt left without notifying staff.

## 2015-06-05 NOTE — ED Notes (Addendum)
Patient c/o intermittent"headache all over." patient states she went to UC 3 days ago and received a pain injection with no relief. Patient c/o numbness right side of face. Patient also c/o swelling to bilateral feet, calves, and ankles x 2 weeks. Right calf swelling > left calf swelling. Patient is tearful and very anxious. Patient has a history of liver cancer.

## 2015-06-11 ENCOUNTER — Emergency Department (INDEPENDENT_AMBULATORY_CARE_PROVIDER_SITE_OTHER)
Admission: EM | Admit: 2015-06-11 | Discharge: 2015-06-11 | Disposition: A | Discharge status: Home / Self Care | Payer: Medicare Other | Source: Home / Self Care | Attending: Emergency Medicine | Admitting: Emergency Medicine

## 2015-06-11 ENCOUNTER — Encounter (HOSPITAL_COMMUNITY): Payer: Self-pay | Admitting: Emergency Medicine

## 2015-06-11 DIAGNOSIS — R030 Elevated blood-pressure reading, without diagnosis of hypertension: Secondary | ICD-10-CM

## 2015-06-11 DIAGNOSIS — R51 Headache: Secondary | ICD-10-CM | POA: Diagnosis not present

## 2015-06-11 DIAGNOSIS — R519 Headache, unspecified: Secondary | ICD-10-CM

## 2015-06-11 DIAGNOSIS — C189 Malignant neoplasm of colon, unspecified: Secondary | ICD-10-CM

## 2015-06-11 DIAGNOSIS — IMO0001 Reserved for inherently not codable concepts without codable children: Secondary | ICD-10-CM

## 2015-06-11 MED ORDER — DIPHENHYDRAMINE HCL 25 MG PO CAPS
50.0000 mg | ORAL_CAPSULE | Freq: Once | ORAL | Status: AC
  Administered 2015-06-11: 50 mg via ORAL

## 2015-06-11 MED ORDER — DEXAMETHASONE SODIUM PHOSPHATE 10 MG/ML IJ SOLN
INTRAMUSCULAR | Status: AC
  Filled 2015-06-11: qty 1

## 2015-06-11 MED ORDER — DEXAMETHASONE SODIUM PHOSPHATE 10 MG/ML IJ SOLN
10.0000 mg | Freq: Once | INTRAMUSCULAR | Status: AC
  Administered 2015-06-11: 10 mg via INTRAMUSCULAR

## 2015-06-11 MED ORDER — METOCLOPRAMIDE HCL 5 MG/ML IJ SOLN
5.0000 mg | Freq: Once | INTRAMUSCULAR | Status: AC
  Administered 2015-06-11: 5 mg via INTRAMUSCULAR

## 2015-06-11 MED ORDER — LOSARTAN POTASSIUM 50 MG PO TABS: 50.0000 mg | ORAL_TABLET | Freq: Every day | ORAL | Status: DC

## 2015-06-11 MED ORDER — METOCLOPRAMIDE HCL 5 MG/ML IJ SOLN
INTRAMUSCULAR | Status: AC
  Filled 2015-06-11: qty 2

## 2015-06-11 MED ORDER — KETOROLAC TROMETHAMINE 60 MG/2ML IM SOLN
60.0000 mg | Freq: Once | INTRAMUSCULAR | Status: AC
  Administered 2015-06-11: 60 mg via INTRAMUSCULAR

## 2015-06-11 MED ORDER — KETOROLAC TROMETHAMINE 60 MG/2ML IM SOLN
INTRAMUSCULAR | Status: AC
  Filled 2015-06-11: qty 2

## 2015-06-11 MED ORDER — DIPHENHYDRAMINE HCL 25 MG PO CAPS
ORAL_CAPSULE | ORAL | Status: AC
  Filled 2015-06-11: qty 2

## 2015-06-11 NOTE — ED Notes (Signed)
Reports hypertension associated w/intermittent HA onset 2 weeks Seen at [facility] ED on 11/16 for similar sx Also c/o bilateral foot edema Steady gait; A&O x4... No acute distress

## 2015-06-11 NOTE — ED Provider Notes (Signed)
CSN: [id number]     Arrival date & time 06/11/15  1517 History   First MD Initiated Contact with Patient 06/11/15 1658     Chief Complaint  Patient presents with  . Hypertension  . Headache   (Consider location/radiation/quality/duration/timing/severity/associated sxs/prior Treatment) HPI Comments: 49 year old female presents with a headache that worsened over the past 2 weeks. She has a history of chronic headaches and was never diagnosed with migraines. The headache is global. She has pain from her nose to the back of her neck. She has been seen at [facility] as well as [facility] emergency department where she received injections similar to a cocktail which we provide here. They did help. She describes the pain as pressure, pushing and shooting. She is complaining of pain to the left side of the neck musculature that is worse with head movement. She denies diplopia but is complaining of an occasional hazy vision. She has also had nausea but no vomiting. She has "a little" photophobia." No sonophobia. Denies focal paresthesias or weakness. Denies problems with memory or cognition. During a visit to [facility] ED recently she had a CT scan of the head which was normal. That history and exam was similar to today's presentation.. There were no abnormal neurologic findings. No suspicion for [facility] or other bleed or mass. She states her blood pressure has elevated over the last 2-3 weeks. She states her diastolics have been up to 149 taken by an automatic home blood pressure machine taken at the wrist. She has recently been diagnosed with colon cancer and is receiving chemotherapy.   Past Medical History  Diagnosis Date  . Tumor of rectum   . Cancer ([place])   . Liver cancer ([place])   . Liver cancer [facility])    Past Surgical History  Procedure Laterality Date  . Foot surgery    . Tumor removal      from rectum area 02/2013   Family History  Problem Relation Age of Onset  . Cancer Mother    pancreatic   Social History  Substance Use Topics  . Smoking status: Never Smoker   . Smokeless tobacco: Never Used  . Alcohol Use: No   OB History    No data available     Review of Systems  Constitutional: Positive for activity change. Negative for fever.  HENT: Positive for congestion. Negative for ear discharge, hearing loss and sore throat.   Eyes: Negative for pain, discharge, redness and itching.       See history of present illness  Respiratory: Negative for cough, choking and stridor.   Cardiovascular: Negative.   Gastrointestinal: Positive for nausea. Negative for vomiting, abdominal pain and diarrhea.  Genitourinary: Negative.   Musculoskeletal: Positive for myalgias and neck pain. Negative for back pain and neck stiffness.  Skin: Negative.  Negative for rash.  Neurological: Positive for headaches. Negative for tremors, seizures, syncope, facial asymmetry, speech difficulty and numbness.  Hematological: Does not bruise/bleed easily.  Psychiatric/Behavioral: Negative.  Negative for behavioral problems, confusion and self-injury.  All other systems reviewed and are negative.   Allergies  Codeine; Hydrocodone; Oxycodone; Percocet; and Tramadol  Home Medications   Prior to Admission medications   Medication Sig Start Date End Date Taking? Authorizing Provider  everolimus (AFINITOR) 10 MG tablet Take 10 mg by mouth daily. 09/11/14  Yes Historical Provider, MD  LYRICA 50 MG capsule Take 50 mg by mouth 2 (two) times daily.  06/25/14  Yes Historical Provider, MD  octreotide (SANDOSTATIN  LAR) 20 MG injection Inject 20 mg into the muscle every 28 (twenty-eight) days.   Yes Historical Provider, MD  acetaminophen (TYLENOL) 500 MG tablet Take 500 mg by mouth every 8 (eight) hours as needed for headache.     Historical Provider, MD  amphetamine-dextroamphetamine (ADDERALL) 20 MG tablet Take 20 mg by mouth 2 (two) times daily.    Historical Provider, MD  aspirin EC 81 MG tablet  Take 81 mg by mouth.    Historical Provider, MD  aspirin-acetaminophen-caffeine (EXCEDRIN MIGRAINE) [phone] MG tablet Take 2 tablets by mouth every 6 (six) hours as needed for headache.    Historical Provider, MD  Cholecalciferol 1000 UNITS tablet Take by mouth.    Historical Provider, MD  cyclobenzaprine (FLEXERIL) 10 MG tablet Take 10 mg by mouth daily as needed for muscle spasms.  12/28/13   Historical Provider, MD  Glutamine 500 MG CAPS Take by mouth.    Historical Provider, MD  ibuprofen (ADVIL,MOTRIN) 200 MG tablet Take 400 mg by mouth daily as needed for headache.     Historical Provider, MD  leuprolide (LUPRON DEPOT) 11.25 MG injection Inject 11.25 mg into the muscle every 3 (three) months. 01/15/15 07/14/15  Historical Provider, MD  losartan (COZAAR) 50 MG tablet Take 1 tablet (50 mg total) by mouth daily. 06/11/15   Janne Napoleon, NP  NON FORMULARY Take 8 capsules by mouth daily. Juice plus vitamin gummies    Historical Provider, MD  Omega-3 Fatty Acids (FISH OIL) 1000 MG CAPS Take by mouth.    Historical Provider, MD  OVER THE COUNTER MEDICATION Take 2 capsules by mouth daily. turmeric    Historical Provider, MD  OVER THE COUNTER MEDICATION Take 2 Doses by mouth daily. Protein powder    Historical Provider, MD  OVER THE COUNTER MEDICATION Take 1 Dose by mouth daily. Green algae powder    Historical Provider, MD  OVER THE COUNTER MEDICATION Take 1 Dose by mouth daily. Coq  10 liquid    Historical Provider, MD  polyethylene glycol (MIRALAX / GLYCOLAX) packet Take 17 g by mouth 3 (three) times daily as needed for mild constipation.  03/27/15   Historical Provider, MD  Probiotic Product (PROBIOTIC PO) Take 1 capsule by mouth daily.    Historical Provider, MD  promethazine (PHENERGAN) 25 MG tablet Take 25 mg by mouth every 6 (six) hours as needed for nausea.  06/02/15   Historical Provider, MD   Meds Ordered and Administered this Visit   Medications  ketorolac (TORADOL) injection 60 mg (60 mg  Intramuscular Given 06/11/15 1751)  metoCLOPramide (REGLAN) injection 5 mg (5 mg Intramuscular Given 06/11/15 1752)  dexamethasone (DECADRON) injection 10 mg (10 mg Intramuscular Given 06/11/15 1752)  diphenhydrAMINE (BENADRYL) capsule 50 mg (50 mg Oral Given 06/11/15 1752)    BP 169/93 mmHg  Pulse 81  Temp(Src) 97.7 F (36.5 C) (Oral)  Resp 16  SpO2 99% No data found.   Physical Exam  Constitutional: She is oriented to person, place, and time. She appears well-developed and well-nourished. No distress.  HENT:  Head: Normocephalic and atraumatic.  Right Ear: External ear normal.  Left Ear: External ear normal.  Mouth/Throat: Oropharynx is clear and moist. No oropharyngeal exudate.  Eyes: Conjunctivae and EOM are normal. Pupils are equal, round, and reactive to light. Right eye exhibits no discharge. Left eye exhibits no discharge.  Neck: Normal range of motion. Neck supple.  There is tenderness to the left trapezius muscle and left para cervical musculature. No spinal  tenderness. No neck stiffness or rigidity.  Cardiovascular: Normal rate, regular rhythm, normal heart sounds and intact distal pulses.   Pulmonary/Chest: Effort normal and breath sounds normal. No respiratory distress. She has no wheezes. She has no rales.  Abdominal: Soft. There is no tenderness.  Musculoskeletal: Normal range of motion. She exhibits no tenderness.  Mild 1+ nonpitting edema to the left ankle and lesser to the lower extremity below the knee.  Lymphadenopathy:    She has no cervical adenopathy.  Neurological: She is alert and oriented to person, place, and time. She has normal strength. She displays no tremor. No cranial nerve deficit or sensory deficit. She exhibits normal muscle tone. Coordination and gait normal.  Skin: Skin is warm and dry. No rash noted. No erythema.  Psychiatric: She has a normal mood and affect. Her behavior is normal. Judgment and thought content normal.  Nursing note and  vitals reviewed.   ED Course  Procedures (including critical care time)  Labs Review Labs Reviewed - No data to display  Imaging Review No results found.   Visual Acuity Review  Right Eye Distance:   Left Eye Distance:   Bilateral Distance:    Right Eye Near:   Left Eye Near:    Bilateral Near:         MDM   1. Headache disorder   2. Elevated blood pressure   3. Malignant neoplasm of colon, unspecified part of colon ([place])    Unknown type headache. May very well be due to her chemotherapy. She does have tenderness to the scalp, posterior neck and left paracervical musculature. There may be some tension headache involved. Doubt this is migraine headache. Unlikely due to hypertension. Due to the high blood pressure she has written down from home and the lack of PCP access will treat with losartan 50 mg at this time. She is going to [facility] to see the oncologist tomorrow and will discuss this prescription with the physician for an opinion on this medication and to see if that will interfere are not with her medication as well has checked her recent labs. She remains fully awake alert, talkative, speech is clear. Her movements are energetic. She does not look acutely ill. Her neurologic exam is completely normal. At 1810 hrs. patient states that she is feeling better and ready to go home. She will    be going to the [facility] oncology Department tomorrow and will mention her headaches and have her blood pressure rechecked tomorrow. She states she has called for a ride home due to the Benadryl administration.   Janne Napoleon, NP 06/11/15 1818

## 2015-06-11 NOTE — Discharge Instructions (Signed)
General Headache Without Cause A headache is pain or discomfort felt around the head or neck area. The specific cause of a headache may not be found. There are many causes and types of headaches. A few common ones are:  Tension headaches.  Migraine headaches.  Cluster headaches.  Chronic daily headaches. HOME CARE INSTRUCTIONS  Watch your condition for any changes. Take these steps to help with your condition: Managing Pain  Take over-the-counter and prescription medicines only as told by your health care provider.  Lie down in a dark, quiet room when you have a headache.  If directed, apply ice to the head and neck area:  Put ice in a plastic bag.  Place a towel between your skin and the bag.  Leave the ice on for 20 minutes, 2-3 times per day.  Use a heating pad or hot shower to apply heat to the head and neck area as told by your health care provider.  Keep lights dim if bright lights bother you or make your headaches worse. Eating and Drinking  Eat meals on a regular schedule.  Limit alcohol use.  Decrease the amount of caffeine you drink, or stop drinking caffeine. General Instructions  Keep all follow-up visits as told by your health care provider. This is important.  Keep a headache journal to help find out what may trigger your headaches. For example, write down:  What you eat and drink.  How much sleep you get.  Any change to your diet or medicines.  Try massage or other relaxation techniques.  Limit stress.  Sit up straight, and do not tense your muscles.  Do not use tobacco products, including cigarettes, chewing tobacco, or e-cigarettes. If you need help quitting, ask your health care provider.  Exercise regularly as told by your health care provider.  Sleep on a regular schedule. Get 7-9 hours of sleep, or the amount recommended by your health care provider. SEEK MEDICAL CARE IF:   Your symptoms are not helped by medicine.  You have a  headache that is different from the usual headache.  You have nausea or you vomit.  You have a fever. SEEK IMMEDIATE MEDICAL CARE IF:   Your headache becomes severe.  You have repeated vomiting.  You have a stiff neck.  You have a loss of vision.  You have problems with speech.  You have pain in the eye or ear.  You have muscular weakness or loss of muscle control.  You lose your balance or have trouble walking.  You feel faint or pass out.  You have confusion.   This information is not intended to replace advice given to you by your health care provider. Make sure you discuss any questions you have with your health care provider.   Document Released: 07/06/2005 Document Revised: 03/27/2015 Document Reviewed: 10/29/2014 Elsevier Interactive Patient Education 2016 [employer].  How to Take Your Blood Pressure HOW DO I GET A BLOOD PRESSURE MACHINE?  You can buy an electronic home blood pressure machine at your local pharmacy. Insurance will sometimes cover the cost if you have a prescription.  Ask your doctor what type of machine is best for you. There are different machines for your arm and your wrist.  If you decide to buy a machine to check your blood pressure on your arm, first check the size of your arm so you can buy the right size cuff. To check the size of your arm:   Use a measuring tape that shows  both inches and centimeters.   Wrap the measuring tape around the upper-middle part of your arm. You may need someone to help you measure.   Write down your arm measurement in both inches and centimeters.   To measure your blood pressure correctly, it is important to have the right size cuff.   If your arm is up to 13 inches (up to 34 centimeters), get an adult cuff size.  If your arm is 13 to 17 inches (35 to 44 centimeters), get a large adult cuff size.    If your arm is 17 to 20 inches (45 to 52 centimeters), get an adult thigh cuff.  WHAT DO THE  NUMBERS MEAN?   There are two numbers that make up your blood pressure. For example: 120/80.  The first number (120 in our example) is called the "systolic pressure." It is a measure of the pressure in your blood vessels when your heart is pumping blood.  The second number (80 in our example) is called the "diastolic pressure." It is a measure of the pressure in your blood vessels when your heart is resting between beats.  Your doctor will tell you what your blood pressure should be. WHAT SHOULD I DO BEFORE I CHECK MY BLOOD PRESSURE?   Try to rest or relax for at least 30 minutes before you check your blood pressure.  Do not smoke.  Do not have any drinks with caffeine, such as:  Soda.  Coffee.  Tea.  Check your blood pressure in a quiet room.  Sit down and stretch out your arm on a table. Keep your arm at about the level of your heart. Let your arm relax.  Make sure that your legs are not crossed. HOW DO I CHECK MY BLOOD PRESSURE?  Follow the directions that came with your machine.  Make sure you remove any tight-fitting clothing from your arm or wrist. Wrap the cuff around your upper arm or wrist. You should be able to fit a finger between the cuff and your arm. If you cannot fit a finger between the cuff and your arm, it is too tight and should be removed and rewrapped.  Some units require you to manually pump up the arm cuff.  Automatic units inflate the cuff when you press a button.  Cuff deflation is automatic in both models.  After the cuff is inflated, the unit measures your blood pressure and pulse. The readings are shown on a monitor. Hold still and breathe normally while the cuff is inflated.  Getting a reading takes less than a minute.  Some models store readings in a memory. Some provide a printout of readings. If your machine does not store your readings, keep a written record.  Take readings with you to your next visit with your doctor.   This  information is not intended to replace advice given to you by your health care provider. Make sure you discuss any questions you have with your health care provider.   Document Released: 06/18/2008 Document Revised: 07/27/2014 Document Reviewed: 08/31/2013 Elsevier Interactive Patient Education 2016 [place] Your High Blood Pressure Blood pressure is a measurement of how forceful your blood is pressing against the walls of the arteries. Arteries are muscular tubes within the circulatory system. Blood pressure does not stay the same. Blood pressure rises when you are active, excited, or nervous; and it lowers during sleep and relaxation. If the numbers measuring your blood pressure stay above normal most of the  time, you are at risk for health problems. High blood pressure (hypertension) is a long-term (chronic) condition in which blood pressure is elevated. A blood pressure reading is recorded as two numbers, such as 120 over 80 (or 120/80). The first, higher number is called the systolic pressure. It is a measure of the pressure in your arteries as the heart beats. The second, lower number is called the diastolic pressure. It is a measure of the pressure in your arteries as the heart relaxes between beats.  Keeping your blood pressure in a normal range is important to your overall health and prevention of health problems, such as heart disease and stroke. When your blood pressure is uncontrolled, your heart has to work harder than normal. High blood pressure is a very common condition in adults because blood pressure tends to rise with age. Men and women are equally likely to have hypertension but at different times in life. Before age 4, men are more likely to have hypertension. After 49 years of age, women are more likely to have it. Hypertension is especially common in African Americans. This condition often has no signs or symptoms. The cause of the condition is usually not known. Your  caregiver can help you come up with a plan to keep your blood pressure in a normal, healthy range. BLOOD PRESSURE STAGES Blood pressure is classified into four stages: normal, prehypertension, stage 1, and stage 2. Your blood pressure reading will be used to determine what type of treatment, if any, is necessary. Appropriate treatment options are tied to these four stages:  Normal  Systolic pressure (mm Hg): below 120.  Diastolic pressure (mm Hg): below 80. Prehypertension  Systolic pressure (mm Hg): 120 to 139.  Diastolic pressure (mm Hg): 80 to 89. Stage1  Systolic pressure (mm Hg): 140 to 159.  Diastolic pressure (mm Hg): 90 to 99. Stage2  Systolic pressure (mm Hg): 160 or above.  Diastolic pressure (mm Hg): 100 or above. RISKS RELATED TO HIGH BLOOD PRESSURE Managing your blood pressure is an important responsibility. Uncontrolled high blood pressure can lead to:  A heart attack.  A stroke.  A weakened blood vessel (aneurysm).  Heart failure.  Kidney damage.  Eye damage.  Metabolic syndrome.  Memory and concentration problems. HOW TO MANAGE YOUR BLOOD PRESSURE Blood pressure can be managed effectively with lifestyle changes and medicines (if needed). Your caregiver will help you come up with a plan to bring your blood pressure within a normal range. Your plan should include the following: Education  Read all information provided by your caregivers about how to control blood pressure.  Educate yourself on the latest guidelines and treatment recommendations. New research is always being done to further define the risks and treatments for high blood pressure. Lifestylechanges  Control your weight.  Avoid smoking.  Stay physically active.  Reduce the amount of salt in your diet.  Reduce stress.  Control any chronic conditions, such as high cholesterol or diabetes.  Reduce your alcohol intake. Medicines  Several medicines (antihypertensive medicines)  are available, if needed, to bring blood pressure within a normal range. Communication  Review all the medicines you take with your caregiver because there may be side effects or interactions.  Talk with your caregiver about your diet, exercise habits, and other lifestyle factors that may be contributing to high blood pressure.  See your caregiver regularly. Your caregiver can help you create and adjust your plan for managing high blood pressure. RECOMMENDATIONS FOR TREATMENT AND FOLLOW-UP  The following recommendations are based on current guidelines for managing high blood pressure in nonpregnant adults. Use these recommendations to identify the proper follow-up period or treatment option based on your blood pressure reading. You can discuss these options with your caregiver.  Systolic pressure of 123456 to [id number] or diastolic pressure of 80 to 89: Follow up with your caregiver as directed.  Systolic pressure of [id number] to 0000000 or diastolic pressure of 90 to 100: Follow up with your caregiver within 2 months.  Systolic pressure above 0000000 or diastolic pressure above [id number]: Follow up with your caregiver within 1 month.  Systolic pressure above [id number] or diastolic pressure above [id number]: Consider antihypertensive therapy; follow up with your caregiver within 1 week.  Systolic pressure above [id number] or diastolic pressure above 123456: Begin antihypertensive therapy; follow up with your caregiver within 1 week.   This information is not intended to replace advice given to you by your health care provider. Make sure you discuss any questions you have with your health care provider.   Document Released: 03/30/2012 Document Reviewed: 03/30/2012 Elsevier Interactive Patient Education [employer].

## 2015-06-25 ENCOUNTER — Ambulatory Visit (INDEPENDENT_AMBULATORY_CARE_PROVIDER_SITE_OTHER): Payer: Medicare Other | Admitting: Family Medicine

## 2015-06-25 ENCOUNTER — Ambulatory Visit (HOSPITAL_BASED_OUTPATIENT_CLINIC_OR_DEPARTMENT_OTHER)
Admission: RE | Admit: 2015-06-25 | Discharge: 2015-06-25 | Disposition: A | Discharge status: Home / Self Care | Payer: Medicare Other | Source: Ambulatory Visit | Attending: Family Medicine | Admitting: Family Medicine

## 2015-06-25 ENCOUNTER — Encounter: Payer: Self-pay | Admitting: Family Medicine

## 2015-06-25 VITALS — BP 177/127 | HR 80 | Ht 65.0 in | Wt 135.0 lb

## 2015-06-25 DIAGNOSIS — M542 Cervicalgia: Secondary | ICD-10-CM | POA: Diagnosis present

## 2015-06-25 DIAGNOSIS — M50322 Other cervical disc degeneration at C5-C6 level: Secondary | ICD-10-CM | POA: Insufficient documentation

## 2015-06-25 DIAGNOSIS — M47892 Other spondylosis, cervical region: Secondary | ICD-10-CM | POA: Diagnosis not present

## 2015-06-25 MED ORDER — PREDNISONE 10 MG PO TABS: ORAL_TABLET | ORAL | Status: DC

## 2015-06-25 MED ORDER — TIZANIDINE HCL 4 MG PO TABS: 4.0000 mg | ORAL_TABLET | Freq: Three times a day (TID) | ORAL | Status: DC | PRN

## 2015-06-25 NOTE — Patient Instructions (Addendum)
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 Prednisone 6 day dose pack to relieve irritation/inflammation. Consider aleve 2 tabs twice a day with food AFTER finishing prednisone. Tizanidine three times a day as needed for muscle spasms (can make you sleepy - if so do not drive while taking this). Consider cervical collar if severely painful. Simple range of motion exercises within limits of pain to prevent further stiffness. Consider physical therapy for stretching, exercises, traction, and modalities in the future. Heat 15 minutes at a time 3-4 times a day to help with spasms. Call me in 1 week to let me know how you're doing - would add therapy if improving, MRI if not improving.

## 2015-07-01 DIAGNOSIS — M542 Cervicalgia: Secondary | ICD-10-CM | POA: Insufficient documentation

## 2015-07-01 DIAGNOSIS — F900 Attention-deficit hyperactivity disorder, predominantly inattentive type: Secondary | ICD-10-CM | POA: Insufficient documentation

## 2015-07-01 DIAGNOSIS — F419 Anxiety disorder, unspecified: Secondary | ICD-10-CM | POA: Insufficient documentation

## 2015-07-01 NOTE — Progress Notes (Signed)
PCP: Ysidro Evert, PA-C  Subjective:   HPI: Patient is a 49 y.o. female here for neck pain.  Patient reports she's had posterior neck pain over a month but has worsened past 2 1/2 weeks. She had hit the left side of her head before this but it was several weeks before this current pain started so unsure if it's related. Reports tingling in right side of neck. Pain level 10/10, sharp - worse on right side. No fever, chills, sweats. No weakness or numbness in extremities. No prior issues with neck.  Past Medical History  Diagnosis Date  . Tumor of rectum   . Cancer ([place])   . Liver cancer ([place])   . Liver cancer [facility])     Current Outpatient Prescriptions on File Prior to Visit  Medication Sig Dispense Refill  . acetaminophen (TYLENOL) 500 MG tablet Take 500 mg by mouth every 8 (eight) hours as needed for headache.     . amphetamine-dextroamphetamine (ADDERALL) 20 MG tablet Take 20 mg by mouth 2 (two) times daily.    Marland Kitchen aspirin EC 81 MG tablet Take 81 mg by mouth.    Marland Kitchen aspirin-acetaminophen-caffeine (EXCEDRIN MIGRAINE) 250-250-65 MG tablet Take 2 tablets by mouth every 6 (six) hours as needed for headache.    . Cholecalciferol 1000 UNITS tablet Take by mouth.    . cyclobenzaprine (FLEXERIL) 10 MG tablet Take 10 mg by mouth daily as needed for muscle spasms.     Marland Kitchen everolimus (AFINITOR) 10 MG tablet Take 10 mg by mouth daily.    . Glutamine 500 MG CAPS Take by mouth.    Marland Kitchen ibuprofen (ADVIL,MOTRIN) 200 MG tablet Take 400 mg by mouth daily as needed for headache.     Marland Kitchen leuprolide (LUPRON DEPOT) 11.25 MG injection Inject 11.25 mg into the muscle every 3 (three) months.    Marland Kitchen losartan (COZAAR) 50 MG tablet Take 1 tablet (50 mg total) by mouth daily. 20 tablet 0  . LYRICA 50 MG capsule Take 50 mg by mouth 2 (two) times daily.   2  . NON FORMULARY Take 8 capsules by mouth daily. Juice plus vitamin gummies    . octreotide (SANDOSTATIN LAR) 20 MG injection Inject 20 mg into the muscle  every 28 (twenty-eight) days.    . Omega-3 Fatty Acids (FISH OIL) 1000 MG CAPS Take by mouth.    Marland Kitchen OVER THE COUNTER MEDICATION Take 2 capsules by mouth daily. turmeric    . OVER THE COUNTER MEDICATION Take 2 Doses by mouth daily. Protein powder    . OVER THE COUNTER MEDICATION Take 1 Dose by mouth daily. Green algae powder    . OVER THE COUNTER MEDICATION Take 1 Dose by mouth daily. Coq  10 liquid    . polyethylene glycol (MIRALAX / GLYCOLAX) packet Take 17 g by mouth 3 (three) times daily as needed for mild constipation.     . Probiotic Product (PROBIOTIC PO) Take 1 capsule by mouth daily.    . promethazine (PHENERGAN) 25 MG tablet Take 25 mg by mouth every 6 (six) hours as needed for nausea.      No current facility-administered medications on file prior to visit.    Past Surgical History  Procedure Laterality Date  . Foot surgery    . Tumor removal      from rectum area 02/2013    Allergies  Allergen Reactions  . Codeine Nausea And Vomiting  . Hydrocodone Nausea And Vomiting  . Oxycodone Nausea And Vomiting  .  Percocet [Oxycodone-Acetaminophen] Nausea And Vomiting  . Tramadol Other (See Comments)    Drowsiness to point that patient can't function    Social History   Social History  . Marital Status: Legally Separated    Spouse Name: N/A  . Number of Children: N/A  . Years of Education: N/A   Occupational History  . Not on file.   Social History Main Topics  . Smoking status: Never Smoker   . Smokeless tobacco: Never Used  . Alcohol Use: No  . Drug Use: No  . Sexual Activity: Not on file   Other Topics Concern  . Not on file   Social History Narrative    Family History  Problem Relation Age of Onset  . Cancer Mother     pancreatic    BP 177/127 mmHg  Pulse 80  Ht 5\' 5"  (1.651 m)  Wt 135 lb (61.236 kg)  BMI 22.47 kg/m2  Review of Systems: See HPI above.    Objective:  Physical Exam:  Gen: NAD, uncomfortable in exam room.  Neck: No gross  deformity, swelling, bruising. TTP bilateral cervical paraspinal regions.  No midline/bony TTP. Minimal motion all directions. BUE strength 5/5.   Sensation intact to light touch.   2+ equal reflexes in triceps, biceps, brachioradialis tendons. Difficulty performing spurlings due to lack of motion. Negative lhermittes.  No radiating pain with straight leg raise. NV intact distal BUEs.    Assessment & Plan:  1. Neck pain - consistent with torticollis.  Independently reviewed radiographs and these are reassuring, no signs of fracture, subluxation.  Discussed options - will start with prednisone then transition to nsaids.  Collar if needed for severe pain.  Heat for spasms.  Call us in a week with an update on her status.  Consider PT, MRI depending on how she's doing.

## 2015-07-01 NOTE — Assessment & Plan Note (Signed)
consistent with torticollis.  Independently reviewed radiographs and these are reassuring, no signs of fracture, subluxation.  Discussed options - will start with prednisone then transition to nsaids.  Collar if needed for severe pain.  Heat for spasms.  Call us in a week with an update on her status.  Consider PT, MRI depending on how she's doing.

## 2015-07-09 ENCOUNTER — Ambulatory Visit: Payer: Medicare Other | Admitting: Family Medicine

## 2016-08-20 ENCOUNTER — Encounter (HOSPITAL_COMMUNITY): Payer: Self-pay

## 2016-08-20 ENCOUNTER — Emergency Department (HOSPITAL_COMMUNITY)
Admission: EM | Admit: 2016-08-20 | Discharge: 2016-08-20 | Disposition: A | Discharge status: Home / Self Care | Payer: Medicare Other | Attending: Emergency Medicine | Admitting: Emergency Medicine

## 2016-08-20 ENCOUNTER — Emergency Department (HOSPITAL_COMMUNITY): Payer: Medicare Other

## 2016-08-20 DIAGNOSIS — F909 Attention-deficit hyperactivity disorder, unspecified type: Secondary | ICD-10-CM | POA: Diagnosis not present

## 2016-08-20 DIAGNOSIS — Z79899 Other long term (current) drug therapy: Secondary | ICD-10-CM | POA: Insufficient documentation

## 2016-08-20 DIAGNOSIS — R51 Headache: Secondary | ICD-10-CM | POA: Insufficient documentation

## 2016-08-20 DIAGNOSIS — Z7982 Long term (current) use of aspirin: Secondary | ICD-10-CM | POA: Insufficient documentation

## 2016-08-20 DIAGNOSIS — Z8505 Personal history of malignant neoplasm of liver: Secondary | ICD-10-CM | POA: Insufficient documentation

## 2016-08-20 DIAGNOSIS — R519 Headache, unspecified: Secondary | ICD-10-CM

## 2016-08-20 LAB — I-STAT CHEM 8, ED
BUN: 9 mg/dL (ref 6–20)
CHLORIDE: 101 mmol/L (ref 101–111)
Calcium, Ion: 1.11 mmol/L — ABNORMAL LOW (ref 1.15–1.40)
Creatinine, Ser: 0.8 mg/dL (ref 0.44–1.00)
Glucose, Bld: 103 mg/dL — ABNORMAL HIGH (ref 65–99)
HCT: 41 % (ref 36.0–46.0)
Hemoglobin: 13.9 g/dL (ref 12.0–15.0)
POTASSIUM: 3.6 mmol/L (ref 3.5–5.1)
SODIUM: 142 mmol/L (ref 135–145)
TCO2: 31 mmol/L (ref 0–100)

## 2016-08-20 LAB — CBC
HCT: 38 % (ref 36.0–46.0)
HEMOGLOBIN: 12.1 g/dL (ref 12.0–15.0)
MCH: 25.2 pg — ABNORMAL LOW (ref 26.0–34.0)
MCHC: 31.8 g/dL (ref 30.0–36.0)
MCV: 79.2 fL (ref 78.0–100.0)
Platelets: 145 10*3/uL — ABNORMAL LOW (ref 150–400)
RBC: 4.8 MIL/uL (ref 3.87–5.11)
RDW: 18.4 % — ABNORMAL HIGH (ref 11.5–15.5)
WBC: 3.8 10*3/uL — AB (ref 4.0–10.5)

## 2016-08-20 MED ORDER — KETOROLAC TROMETHAMINE 15 MG/ML IJ SOLN
15.0000 mg | Freq: Once | INTRAMUSCULAR | Status: AC
  Administered 2016-08-20: 15 mg via INTRAVENOUS
  Filled 2016-08-20: qty 1

## 2016-08-20 MED ORDER — IBUPROFEN 600 MG PO TABS: 600.0000 mg | ORAL_TABLET | Freq: Four times a day (QID) | ORAL | 0 refills | Status: DC | PRN

## 2016-08-20 MED ORDER — SODIUM CHLORIDE 0.9 % IV BOLUS (SEPSIS)
1000.0000 mL | Freq: Once | INTRAVENOUS | Status: AC
  Administered 2016-08-20: 1000 mL via INTRAVENOUS

## 2016-08-20 MED ORDER — METOCLOPRAMIDE HCL 5 MG/ML IJ SOLN
10.0000 mg | Freq: Once | INTRAMUSCULAR | Status: AC
  Administered 2016-08-20: 10 mg via INTRAVENOUS
  Filled 2016-08-20: qty 2

## 2016-08-20 NOTE — ED Triage Notes (Signed)
Pt BIB [facility]. Pt is on a chemo pill that increases her BP. She was hypertensive with EMS and is complaining of a splitting headache that started 45 mins ago. A&Ox4.

## 2016-08-20 NOTE — Discharge Instructions (Signed)
We saw you in the ER for headaches. All the labs and imaging are normal. We are not sure what is causing your headaches, however, there appears to be no evidence of infection, bleeds or tumors based on our exam and results.  Please take motrin round the clock for the next 6 hours. See your doctor if the pain persists, as you might need better medications or a specialist.  Please return to the ER if the headache gets severe and in not improving, you have associated new one sided numbness, tingling, weakness or confusion, seizures, poor balance or poor vision.

## 2016-08-20 NOTE — ED Provider Notes (Addendum)
[facility] DEPT Provider Note   CSN: [id number] Arrival date & time: 08/20/16  1657     History   Chief Complaint Chief Complaint  Patient presents with  . Hypertension    Cancer Patient    HPI Sheila Perkins is a 51 y.o. female.  HPI  Pt with stage 3 colon Ca, on oral chemo currently, comes in with cc of headaches. Pt reports that the headache woke her up at 4 am. The headache is pressure type pain, as if someone is gripping her brain and severe. Pain was 10/10 at onset, currently at 7/10. Pain has been constant, prior to ER arrival it got severe, so she came to the ER. No nausea, vomiting, visual complains, seizures, altered mental status, loss of consciousness, new weakness, or numbness, no gait instability. Pt does report that her headache is worse when supine. Pt also reports that her headache is radiating to the neck.   Past Medical History:  Diagnosis Date  . Cancer ([place])   . Liver cancer ([place])   . Liver cancer ([place])   . Tumor of rectum     Patient Active Problem List   Diagnosis Date Noted  . 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 Anxiety disorder 07/01/2015  . Neck pain 07/01/2015  . 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 Anemia, iron deficiency 04/06/2014  . Chemical conjunctivitis of left eye 04/26/2013  . Acid reflux 03/03/2013  . Gastrointestinal ulcer due to Helicobacter pylori [id number]  . Neuroendocrine tumor 01/29/2013  . Functional constipation 01/29/2013  . Pelvic mass in female 01/05/2013    Past Surgical History:  Procedure Laterality Date  . FOOT SURGERY    . TUMOR REMOVAL     from rectum area 02/2013    OB History    No data available       Home Medications    Prior to Admission medications   Medication Sig Start Date End Date Taking? Authorizing Provider  amLODipine (NORVASC) 5 MG tablet Take 10 mg by mouth daily.  07/30/16 07/30/17 Yes Historical Provider, MD    amphetamine-dextroamphetamine (ADDERALL) 20 MG tablet Take 20 mg by mouth 2 (two) times daily.   Yes Historical Provider, MD  aspirin EC 81 MG tablet Take 81 mg by mouth.   Yes Historical Provider, MD  Cholecalciferol 1000 UNITS tablet Take 1,000 Units by mouth daily.    Yes Historical Provider, MD  cyclobenzaprine (FLEXERIL) 10 MG tablet Take 10 mg by mouth 3 (three) times daily as needed for muscle spasms.  12/28/13  Yes Historical Provider, MD  hydrochlorothiazide (HYDRODIURIL) 12.5 MG tablet Take 12.5 mg by mouth daily. 06/05/16  Yes Historical Provider, MD  losartan (COZAAR) 50 MG tablet Take 1 tablet (50 mg total) by mouth daily. Patient taking differently: Take 100 mg by mouth daily.  06/11/15  Yes Janne Napoleon, NP  LYRICA 50 MG capsule Take 50 mg by mouth 2 (two) times daily.  06/25/14  Yes Historical Provider, MD  OVER THE COUNTER MEDICATION Take 2 capsules by mouth daily. turmeric   Yes Historical Provider, MD  polyethylene glycol (MIRALAX / GLYCOLAX) packet Take 17 g by mouth daily. 06/04/16  Yes Historical Provider, MD  Probiotic Product (PROBIOTIC PO) Take 1 capsule by mouth daily.   Yes Historical Provider, MD  SUNItinib (SUTENT) 37.5 MG capsule Take 37.5 mg by mouth daily. 06/25/16  Yes Historical Provider, MD  ibuprofen (ADVIL,MOTRIN) 600 MG tablet Take 1 tablet (600 mg total) by mouth every  6 (six) hours as needed. 08/20/16   [name], MD    Family History Family History  Problem Relation Age of Onset  . Cancer Mother     pancreatic    Social History Social History  Substance Use Topics  . Smoking status: Never Smoker  . Smokeless tobacco: Never Used  . Alcohol use No     Allergies   Codeine; Hydrocodone; Oxycodone; Percocet [oxycodone-acetaminophen]; and Tramadol   Review of Systems Review of Systems  ROS 10 Systems reviewed and are negative for acute change except as noted in the HPI.     Physical Exam Updated Vital Signs BP 151/89 (BP Location: Left  Arm)   Pulse 60   Temp 99.1 F (37.3 C) (Oral)   Resp 18   Ht 5\' 5"  (1.651 m)   Wt 140 lb (63.5 kg)   SpO2 98%   BMI 23.30 kg/m   Physical Exam  Constitutional: She is oriented to person, place, and time. She appears well-developed and well-nourished.  HENT:  Head: Normocephalic and atraumatic.  Eyes: EOM are normal. Pupils are equal, round, and reactive to light.  Neck: Neck supple.  No nuchal rigidity  Cardiovascular: Normal rate, regular rhythm and normal heart sounds.   No murmur heard. Pulmonary/Chest: Effort normal. No respiratory distress.  Abdominal: Soft. She exhibits no distension. There is no tenderness. There is no rebound and no guarding.  Neurological: She is alert and oriented to person, place, and time. No cranial nerve deficit. Coordination normal.  Skin: Skin is warm and dry.  Nursing note and vitals reviewed.    ED Treatments / Results  Labs (all labs ordered are listed, but only abnormal results are displayed) Labs Reviewed  CBC - Abnormal; Notable for the following:       Result Value   WBC 3.8 (*)    MCH 25.2 (*)    RDW 18.4 (*)    Platelets 145 (*)    All other components within normal limits  I-STAT CHEM 8, ED - Abnormal; Notable for the following:    Glucose, Bld 103 (*)    Calcium, Ion 1.11 (*)    All other components within normal limits    EKG  EKG Interpretation None       Radiology Ct Head Wo Contrast  Result Date: 08/20/2016 CLINICAL DATA:  Hypertensive.  Headache, onset 45 minutes ago. EXAM: CT HEAD WITHOUT CONTRAST TECHNIQUE: Contiguous axial images were obtained from the base of the skull through the vertex without intravenous contrast. COMPARISON:  06/05/2015 FINDINGS: Brain: There is no intracranial hemorrhage, mass or evidence of acute infarction. There is no extra-axial fluid collection. Gray matter and white matter appear normal. Cerebral volume is normal for age. Brainstem and posterior fossa are unremarkable. The CSF  spaces appear normal. Vascular: No hyperdense vessel or unexpected calcification. Skull: Normal. Negative for fracture or focal lesion. Sinuses/Orbits: No acute finding. Other: None. IMPRESSION: Normal brain Electronically Signed   By: Andreas Newport M.D.   On: 08/20/2016 18:29    Procedures Procedures (including critical care time)  Medications Ordered in ED Medications  ketorolac (TORADOL) 15 MG/ML injection 15 mg (15 mg Intravenous Given 08/20/16 2020)  metoCLOPramide (REGLAN) injection 10 mg (10 mg Intravenous Given 08/20/16 2020)  sodium chloride 0.9 % bolus 1,000 mL (0 mLs Intravenous Stopped 08/20/16 2119)     Initial Impression / Assessment and Plan / ED Course  I have reviewed the triage vital signs and the nursing notes.  Pertinent labs &  imaging results that were available during my care of the patient were reviewed by me and considered in my medical decision making (see chart for details).  Clinical Course as of Aug 20 2118  Thu Aug 20, 2016  [id number] Ct head is neg for acute bleed. We will get toradol and reglan and reassess. If still having severe headache, we will get CT angio.   [AN]  2117 Upon reassessment, patient reports that the headache has improved dramatically ,and is down to 3/10. She continued to have no neurologic complains. Strict return precautions discussed, pt will return to the ER if there is visual complains, seizures, altered mental status, loss of consciousness, dizziness, new focal weakness, or numbness. Pt agrees with the plan.    [AN]    Clinical Course User Index [AN] [name], MD   Pt comes in with cc of headache. Hx of colon CA. Headache is worse with laying flat - and with cancer hx and severe headaches that are new, CT is needed to r/o brain mets.   Final Clinical Impressions(s) / ED Diagnoses   Final diagnoses:  Bad headache    New Prescriptions New Prescriptions   IBUPROFEN (ADVIL,MOTRIN) 600 MG TABLET    Take 1 tablet (600 mg  total) by mouth every 6 (six) hours as needed.     [name], MD 08/20/16 1925    [name], MD 08/20/16 2120

## 2016-08-20 NOTE — ED Notes (Signed)
Removed Piv 20 out of right fore arm.

## 2016-08-20 NOTE — ED Notes (Signed)
Bed: WA06 Expected date:  Expected time:  Means of arrival:  Comments: Ca. Pt hypertension

## 2017-01-10 ENCOUNTER — Emergency Department (HOSPITAL_COMMUNITY): Payer: Medicare Other

## 2017-01-10 ENCOUNTER — Emergency Department (HOSPITAL_COMMUNITY)
Admission: EM | Admit: 2017-01-10 | Discharge: 2017-01-10 | Disposition: A | Discharge status: Home / Self Care | Payer: Medicare Other | Attending: Emergency Medicine | Admitting: Emergency Medicine

## 2017-01-10 ENCOUNTER — Other Ambulatory Visit: Payer: Self-pay

## 2017-01-10 ENCOUNTER — Encounter (HOSPITAL_COMMUNITY): Payer: Self-pay | Admitting: Emergency Medicine

## 2017-01-10 DIAGNOSIS — Z8505 Personal history of malignant neoplasm of liver: Secondary | ICD-10-CM | POA: Diagnosis not present

## 2017-01-10 DIAGNOSIS — Z79899 Other long term (current) drug therapy: Secondary | ICD-10-CM | POA: Insufficient documentation

## 2017-01-10 DIAGNOSIS — R0789 Other chest pain: Secondary | ICD-10-CM

## 2017-01-10 DIAGNOSIS — R0602 Shortness of breath: Secondary | ICD-10-CM | POA: Diagnosis present

## 2017-01-10 DIAGNOSIS — F909 Attention-deficit hyperactivity disorder, unspecified type: Secondary | ICD-10-CM | POA: Diagnosis not present

## 2017-01-10 LAB — COMPREHENSIVE METABOLIC PANEL
ALK PHOS: 156 U/L — AB (ref 38–126)
ALT: 31 U/L (ref 14–54)
AST: 31 U/L (ref 15–41)
Albumin: 3.7 g/dL (ref 3.5–5.0)
Anion gap: 9 (ref 5–15)
BILIRUBIN TOTAL: 0.5 mg/dL (ref 0.3–1.2)
BUN: 13 mg/dL (ref 6–20)
CALCIUM: 9.5 mg/dL (ref 8.9–10.3)
CO2: 28 mmol/L (ref 22–32)
CREATININE: 0.7 mg/dL (ref 0.44–1.00)
Chloride: 102 mmol/L (ref 101–111)
GFR calc Af Amer: >60 mL/min (ref >60)
Glucose, Bld: 105 mg/dL — ABNORMAL HIGH (ref 65–99)
POTASSIUM: 3.8 mmol/L (ref 3.5–5.1)
Sodium: 139 mmol/L (ref 135–145)
TOTAL PROTEIN: 7.4 g/dL (ref 6.5–8.1)

## 2017-01-10 LAB — URINALYSIS, ROUTINE W REFLEX MICROSCOPIC
BILIRUBIN URINE: NEGATIVE
GLUCOSE, UA: NEGATIVE mg/dL
HGB URINE DIPSTICK: NEGATIVE
KETONES UR: NEGATIVE mg/dL
Leukocytes, UA: NEGATIVE
Nitrite: NEGATIVE
PROTEIN: NEGATIVE mg/dL
Specific Gravity, Urine: 1.016 (ref 1.005–1.030)
pH: 5 (ref 5.0–8.0)

## 2017-01-10 LAB — CBC WITH DIFFERENTIAL/PLATELET
BASOS ABS: 0 10*3/uL (ref 0.0–0.1)
Basophils Relative: 0 %
EOS ABS: 0.1 10*3/uL (ref 0.0–0.7)
EOS PCT: 1 %
HCT: 33.7 % — ABNORMAL LOW (ref 36.0–46.0)
Hemoglobin: 10.6 g/dL — ABNORMAL LOW (ref 12.0–15.0)
Lymphocytes Relative: 23 %
Lymphs Abs: 1.6 10*3/uL (ref 0.7–4.0)
MCH: 27.5 pg (ref 26.0–34.0)
MCHC: 31.5 g/dL (ref 30.0–36.0)
MCV: 87.5 fL (ref 78.0–100.0)
MONO ABS: 0.9 10*3/uL (ref 0.1–1.0)
Monocytes Relative: 13 %
Neutro Abs: 4.6 10*3/uL (ref 1.7–7.7)
Neutrophils Relative %: 63 %
PLATELETS: 233 10*3/uL (ref 150–400)
RBC: 3.85 MIL/uL — ABNORMAL LOW (ref 3.87–5.11)
RDW: 13.1 % (ref 11.5–15.5)
WBC: 7.3 10*3/uL (ref 4.0–10.5)

## 2017-01-10 LAB — LIPASE, BLOOD: Lipase: 18 U/L (ref 11–51)

## 2017-01-10 MED ORDER — IOPAMIDOL (ISOVUE-370) INJECTION 76%
100.0000 mL | Freq: Once | INTRAVENOUS | Status: AC | PRN
Start: 2017-01-10 — End: 2017-01-10
  Administered 2017-01-10: 100 mL via INTRAVENOUS

## 2017-01-10 MED ORDER — IOPAMIDOL (ISOVUE-370) INJECTION 76%
INTRAVENOUS | Status: AC
  Filled 2017-01-10: qty 100

## 2017-01-10 NOTE — ED Notes (Addendum)
Cancer pt c/o RUQ abdominal pain moving up towards the center of the chest that started about a month ago and has gotten progressively worse.

## 2017-01-10 NOTE — Discharge Instructions (Signed)
We saw you in the ER for the shortness of breath and chest pains / upper abdominal pain. All the results in the ER are normal, labs and imaging. No evidence of blood clots. We are not sure what is causing your symptoms. The workup in the ER is not complete, and is limited to screening for life threatening and emergent conditions only, so please see a primary care doctor for further evaluation.

## 2017-01-10 NOTE — ED Notes (Signed)
Bed: WA19 Expected date:  Expected time:  Means of arrival:  Comments: Room 5 

## 2017-01-10 NOTE — ED Provider Notes (Signed)
[name] Provider Note   CSN: [id number] Arrival date & time: 01/10/17  0153  By signing my name below, I, Sheila Perkins, attest that this documentation has been prepared under the direction and in the presence of Sheila Biles, MD . Electronically Signed: Levester Perkins, Scribe. 01/10/2017. 4:24 AM.  History   Chief Complaint Chief Complaint  Patient presents with  . Shortness of Breath  . Abdominal Pain   HPI Comments Sheila Perkins is a 51 y.o. 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  Sx have been intermittent over the last x2 wks, but worse tonight.  Pt was at rest when pain began.  Chest pain is constant, worse with exertion.  Currently rated a 10/10 in severity.  Not worse with breathing, coughing.  No associated nausea, diaphoresis, dizziness or syncope.  Abdominal pain at pt's baseline.  Pt is a non-smoker. Denies any hx of illicit drug use.  Here, she denies experiencing any other acute sx.  The history is provided by the patient and medical records. No language interpreter was used.   Past Medical History:  Diagnosis Date  . Cancer ([place])   . Liver cancer ([place])   . Liver cancer ([place])   . Tumor of rectum     Patient Active Problem List   Diagnosis Date Noted  . 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 Anxiety disorder 07/01/2015  . Neck pain 07/01/2015  . 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 Anemia, iron deficiency 04/06/2014  . Chemical conjunctivitis of left eye 04/26/2013  . Acid reflux 03/03/2013  . Gastrointestinal ulcer due to Helicobacter pylori 82/95/6213  . Neuroendocrine tumor 01/29/2013  . Functional constipation 01/29/2013  . Pelvic mass in female 01/05/2013    Past Surgical History:  Procedure Laterality Date  . FOOT SURGERY    . TUMOR REMOVAL      from rectum area 02/2013    OB History    No data available       Home Medications    Prior to Admission medications   Medication Sig Start Date End Date Taking? Authorizing Provider  amphetamine-dextroamphetamine (ADDERALL) 20 MG tablet Take 20 mg by mouth 2 (two) times daily.   Yes [provider]  aspirin EC 81 MG tablet Take 81 mg by mouth daily.    Yes [provider]  Cholecalciferol 1000 UNITS tablet Take 1,000 Units by mouth daily.    Yes [provider]  hydrochlorothiazide (HYDRODIURIL) 25 MG tablet TAKE 1 TABLET (25 MG TOTAL) BY MOUTH DAILY. 10/05/16  Yes [provider]  LORazepam (ATIVAN) 1 MG tablet TAKE 1 TABLET BY MOUTH 30 MINUTES PRIOR TO THE MRI AND AT BEDTIME AS NEEDED 11/05/16  Yes [provider]  losartan (COZAAR) 100 MG tablet Take 100 mg by mouth daily. 10/12/16  Yes [provider]  LYRICA 50 MG capsule Take 50 mg by mouth 2 (two) times daily.  06/25/14  Yes [provider]  Multiple Vitamin (MULTI-VITAMINS) TABS Take by mouth.   Yes [provider]  SUNItinib (SUTENT) 37.5 MG capsule Take 37.5 mg by mouth daily. 06/25/16  Yes [provider]  ibuprofen (ADVIL,MOTRIN) 600 MG tablet Take 1 tablet (600 mg total) by mouth every 6 (six) hours as needed. Patient not taking: Reported on 01/10/2017 08/20/16   Sheila Biles, MD  losartan (COZAAR)  50 MG tablet Take 1 tablet (50 mg total) by mouth daily. Patient not taking: Reported on 01/10/2017 06/11/15   Sheila Napoleon, NP  Probiotic Product (PROBIOTIC PO) Take 1 capsule by mouth daily.    [provider]    Family History Family History  Problem Relation Age of Onset  . Cancer Mother        pancreatic    Social History Social History  Substance Use Topics  . Smoking status: Never Smoker  . Smokeless tobacco: Never Used  . Alcohol use No    Allergies   Codeine; Hydrocodone; Oxycodone; Percocet [oxycodone-acetaminophen];  and Tramadol   Review of Systems Review of Systems  Constitutional: Negative for diaphoresis.  Respiratory: Positive for cough and shortness of breath.   Cardiovascular: Positive for chest pain.  Gastrointestinal: Positive for abdominal pain (Pt's baseline). Negative for nausea and vomiting.  Neurological: Negative for dizziness and syncope.  All other systems reviewed and are negative.   Physical Exam Updated Vital Signs BP 122/85   Pulse 64   Temp 98 F (36.7 C) (Oral)   Resp 15   Ht 5\' 6"  (1.676 m)   Wt 65.8 kg (145 lb)   SpO2 99%   BMI 23.40 kg/m   Physical Exam  Constitutional: She is oriented to person, place, and time. She appears well-developed and well-nourished. No distress.  HENT:  Head: Normocephalic and atraumatic.  Eyes: EOM are normal.  Neck: Normal range of motion.  Cardiovascular: Normal rate, regular rhythm and normal heart sounds.   Pulmonary/Chest: Effort normal and breath sounds normal.  Abdominal: Soft.  Pt has epigastric and RUQ tenderness with guarding.  Pt also has tenderness right below the manubrium.  Musculoskeletal: Normal range of motion.  No lower extremity pitting edema, or unilateral calf swelling.  Neurological: She is alert and oriented to person, place, and time.  Skin: Skin is warm and dry.  Psychiatric: She has a normal mood and affect.  Nursing note and vitals reviewed.  ED Treatments / Results  DIAGNOSTIC STUDIES: Oxygen Saturation is 100% on room air, normal by my interpretation.    COORDINATION OF CARE: 3:51 AM Discussed treatment plan with pt at bedside and pt agreed to plan.  Labs (all labs ordered are listed, but only abnormal results are displayed) Labs Reviewed  COMPREHENSIVE METABOLIC PANEL - Abnormal; Notable for the following:       Result Value   Glucose, Bld 105 (*)    Alkaline Phosphatase 156 (*)    All other components within normal limits  CBC WITH DIFFERENTIAL/PLATELET - Abnormal; Notable for the  following:    RBC 3.85 (*)    Hemoglobin 10.6 (*)    HCT 33.7 (*)    All other components within normal limits  LIPASE, BLOOD  URINALYSIS, ROUTINE W REFLEX MICROSCOPIC    EKG  EKG Interpretation None       Radiology Dg Chest 2 View  Result Date: 01/10/2017 CLINICAL DATA:  Shortness of breath. EXAM: CHEST  2 VIEW COMPARISON:  12/15/2010 FINDINGS: The cardiomediastinal contours are normal. The lungs are clear. Pulmonary vasculature is normal. No consolidation, pleural effusion, or pneumothorax. No acute osseous abnormalities are seen. IMPRESSION: No acute abnormality. Electronically Signed   By: Jeb Levering M.D.   On: 01/10/2017 02:41   Ct Angio Chest Pe W And/or Wo Contrast  Result Date: 01/10/2017 CLINICAL DATA:  Shortness of breath. Epigastric pain. History of cancer. 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 EXAM: CT  ANGIOGRAPHY CHEST WITH CONTRAST TECHNIQUE: Multidetector CT imaging of the chest was performed using the standard protocol during bolus administration of intravenous contrast. Multiplanar CT image reconstructions and MIPs were obtained to evaluate the vascular anatomy. CONTRAST:  100 cc Isovue 370 IV COMPARISON:  Chest radiograph earlier this day. Report from chest CT 12/11/2016 at an outside institution, images not available. FINDINGS: Cardiovascular: There are no filling defects within the pulmonary arteries to suggest pulmonary embolus. The thoracic aorta is normal in caliber without dissection. Common origin of the brachiocephalic left common carotid artery, normal variant. The heart is normal in size. No pericardial effusion. Mediastinum/Nodes: No mediastinal, hilar, or axillary adenopathy. Small superior mediastinal and upper paratracheal nodes are not enlarged by size criteria. Visualized thyroid gland is normal. Patulous esophagus. Lungs/Pleura: Other than mild dependent atelectasis at the lung bases, the lungs are clear. No  consolidation, pulmonary edema or pleural fluid. No pulmonary nodule or mass. Upper Abdomen: Multiple hypodense lesions throughout the liver. Musculoskeletal: There are no acute or suspicious osseous abnormalities. No blastic or destructive lytic lesions. Review of the MIP images confirms the above findings. IMPRESSION: 1. No pulmonary embolus or acute intrathoracic abnormality. 2. Hypodense lesions throughout the liver consistent with metastatic disease. Electronically Signed   By: Jeb Levering M.D.   On: 01/10/2017 05:45    Procedures Procedures (including critical care time)  Medications Ordered in ED Medications  iopamidol (ISOVUE-370) 76 % injection (not administered)  iopamidol (ISOVUE-370) 76 % injection 100 mL (100 mLs Intravenous Contrast Given 01/10/17 0515)     Initial Impression / Assessment and Plan / ED Course  I have reviewed the triage vital signs and the nursing notes.  Pertinent labs & imaging results that were available during my care of the patient were reviewed by me and considered in my medical decision making (see chart for details).    I personally performed the services described in this documentation, which was scribed in my presence. The recorded information has been reviewed and is accurate.   Pt comes in with cc of DIB and chest pain. Pt has hx of liver CA with mets, neuroendocrine tumors. She has no CAD hx and the pain doesn't appear cardiac in nature. PT has dib as well  As pleuritic component to the chest pain - so CT ordered and it is neg for Pe.  Results from the ER workup discussed with the patient face to face and all questions answered to the best of my ability.  Strict ER return precautions have been discussed, and patient is agreeing with the plan and is comfortable with the workup done and the recommendations from the ER.    Final Clinical Impressions(s) / ED Diagnoses   Final diagnoses:  Atypical chest pain  Shortness of breath     New Prescriptions New Prescriptions   No medications on file     Sheila Biles, MD 01/10/17 [phone]

## 2017-01-10 NOTE — ED Triage Notes (Signed)
Patient is complaining of abdominal pain. Patient states she is having trouble breathing. Patient O2 sat 100% and does not look like she is in distress. Patient states that she has been having this pain for two weeks and has gotten worse. Patient balance is off per patient.

## 2017-03-17 ENCOUNTER — Emergency Department (HOSPITAL_COMMUNITY): Payer: Medicare Other

## 2017-03-17 ENCOUNTER — Encounter (HOSPITAL_COMMUNITY): Payer: Self-pay | Admitting: Emergency Medicine

## 2017-03-17 ENCOUNTER — Emergency Department (HOSPITAL_COMMUNITY)
Admission: EM | Admit: 2017-03-17 | Discharge: 2017-03-18 | Disposition: A | Discharge status: Home / Self Care | Payer: Medicare Other | Attending: Emergency Medicine | Admitting: Emergency Medicine

## 2017-03-17 DIAGNOSIS — R05 Cough: Secondary | ICD-10-CM | POA: Diagnosis not present

## 2017-03-17 DIAGNOSIS — R0789 Other chest pain: Secondary | ICD-10-CM | POA: Insufficient documentation

## 2017-03-17 DIAGNOSIS — R160 Hepatomegaly, not elsewhere classified: Secondary | ICD-10-CM

## 2017-03-17 DIAGNOSIS — N390 Urinary tract infection, site not specified: Secondary | ICD-10-CM

## 2017-03-17 DIAGNOSIS — R0602 Shortness of breath: Secondary | ICD-10-CM | POA: Diagnosis not present

## 2017-03-17 DIAGNOSIS — Z79899 Other long term (current) drug therapy: Secondary | ICD-10-CM | POA: Insufficient documentation

## 2017-03-17 DIAGNOSIS — C787 Secondary malignant neoplasm of liver and intrahepatic bile duct: Secondary | ICD-10-CM | POA: Diagnosis not present

## 2017-03-17 DIAGNOSIS — R531 Weakness: Secondary | ICD-10-CM | POA: Diagnosis present

## 2017-03-17 DIAGNOSIS — Z85040 Personal history of malignant carcinoid tumor of rectum: Secondary | ICD-10-CM | POA: Insufficient documentation

## 2017-03-17 DIAGNOSIS — R059 Cough, unspecified: Secondary | ICD-10-CM

## 2017-03-17 HISTORY — DX: Essential (primary) hypertension: I10

## 2017-03-17 LAB — URINALYSIS, ROUTINE W REFLEX MICROSCOPIC
BILIRUBIN URINE: NEGATIVE
GLUCOSE, UA: NEGATIVE mg/dL
Ketones, ur: NEGATIVE mg/dL
Nitrite: NEGATIVE
PH: 5 (ref 5.0–8.0)
Protein, ur: NEGATIVE mg/dL
SPECIFIC GRAVITY, URINE: 1.012 (ref 1.005–1.030)

## 2017-03-17 LAB — CBC WITH DIFFERENTIAL/PLATELET
Basophils Absolute: 0 10*3/uL (ref 0.0–0.1)
Basophils Relative: 0 %
EOS ABS: 0.1 10*3/uL (ref 0.0–0.7)
EOS PCT: 1 %
HCT: 34.8 % — ABNORMAL LOW (ref 36.0–46.0)
HEMOGLOBIN: 11.6 g/dL — AB (ref 12.0–15.0)
LYMPHS ABS: 1.9 10*3/uL (ref 0.7–4.0)
LYMPHS PCT: 13 %
MCH: 26 pg (ref 26.0–34.0)
MCHC: 33.3 g/dL (ref 30.0–36.0)
MCV: 77.9 fL — AB (ref 78.0–100.0)
MONOS PCT: 14 %
Monocytes Absolute: 2.1 10*3/uL — ABNORMAL HIGH (ref 0.1–1.0)
Neutro Abs: 10.7 10*3/uL — ABNORMAL HIGH (ref 1.7–7.7)
Neutrophils Relative %: 72 %
PLATELETS: 405 10*3/uL — AB (ref 150–400)
RBC: 4.47 MIL/uL (ref 3.87–5.11)
RDW: 14.8 % (ref 11.5–15.5)
WBC: 14.8 10*3/uL — AB (ref 4.0–10.5)

## 2017-03-17 LAB — COMPREHENSIVE METABOLIC PANEL
ALBUMIN: 3.2 g/dL — AB (ref 3.5–5.0)
ALT: 52 U/L (ref 14–54)
ANION GAP: 9 (ref 5–15)
AST: 90 U/L — ABNORMAL HIGH (ref 15–41)
Alkaline Phosphatase: 562 U/L — ABNORMAL HIGH (ref 38–126)
BILIRUBIN TOTAL: 1.1 mg/dL (ref 0.3–1.2)
BUN: 19 mg/dL (ref 6–20)
CHLORIDE: 99 mmol/L — AB (ref 101–111)
CO2: 27 mmol/L (ref 22–32)
Calcium: 9 mg/dL (ref 8.9–10.3)
Creatinine, Ser: 1.03 mg/dL — ABNORMAL HIGH (ref 0.44–1.00)
GFR calc Af Amer: >60 mL/min (ref >60)
GFR calc non Af Amer: >60 mL/min (ref >60)
GLUCOSE: 87 mg/dL (ref 65–99)
POTASSIUM: 3.5 mmol/L (ref 3.5–5.1)
SODIUM: 135 mmol/L (ref 135–145)
Total Protein: 7.6 g/dL (ref 6.5–8.1)

## 2017-03-17 LAB — TROPONIN I: TROPONIN I: 0.03 ng/mL — AB (ref <0.03)

## 2017-03-17 LAB — BRAIN NATRIURETIC PEPTIDE: B Natriuretic Peptide: 35.2 pg/mL (ref 0.0–100.0)

## 2017-03-17 LAB — CG4 I-STAT (LACTIC ACID): LACTIC ACID, VENOUS: 1.52 mmol/L (ref 0.5–1.9)

## 2017-03-17 LAB — LIPASE, BLOOD: LIPASE: 20 U/L (ref 11–51)

## 2017-03-17 MED ORDER — SODIUM CHLORIDE 0.9 % IV BOLUS (SEPSIS)
1000.0000 mL | Freq: Once | INTRAVENOUS | Status: AC
  Administered 2017-03-17: 1000 mL via INTRAVENOUS

## 2017-03-17 MED ORDER — IOPAMIDOL (ISOVUE-300) INJECTION 61%
INTRAVENOUS | Status: AC
  Filled 2017-03-17: qty 100

## 2017-03-17 MED ORDER — IOPAMIDOL (ISOVUE-300) INJECTION 61%: 100.0000 mL | Freq: Once | INTRAVENOUS | Status: DC | PRN

## 2017-03-17 MED ORDER — CEPHALEXIN 500 MG PO CAPS: 500.0000 mg | ORAL_CAPSULE | Freq: Three times a day (TID) | ORAL | 0 refills | Status: DC

## 2017-03-17 MED ORDER — BENZONATATE 100 MG PO CAPS
100.0000 mg | ORAL_CAPSULE | Freq: Once | ORAL | Status: AC
  Administered 2017-03-17: 100 mg via ORAL
  Filled 2017-03-17: qty 1

## 2017-03-17 MED ORDER — BENZONATATE 100 MG PO CAPS: 100.0000 mg | ORAL_CAPSULE | Freq: Three times a day (TID) | ORAL | 0 refills | Status: DC | PRN

## 2017-03-17 MED ORDER — DEXTROSE 5 % IV SOLN
1.0000 g | Freq: Once | INTRAVENOUS | Status: AC
  Administered 2017-03-17: 1 g via INTRAVENOUS
  Filled 2017-03-17: qty 10

## 2017-03-17 NOTE — ED Notes (Signed)
Date and time results received: 03/17/17 2142   Test: Troponin Critical Value: 0.03  Name of Provider Notified: Julianne Rice  Orders Received? Or Actions Taken?: none

## 2017-03-17 NOTE — ED Notes (Signed)
Informed pt that a urine was needed and pt stated cant void at this time

## 2017-03-17 NOTE — ED Notes (Signed)
Patient transported to CT 

## 2017-03-17 NOTE — ED Triage Notes (Addendum)
Pt verbalizes "has rare cancer" called [facility] and getting two chemo treatments from [place]. [facility] staff concerned related to adverse effects of chemo could cause lung inflammation/infection. Pt complaint of fatigue, cough, bilateral ribcage pain, SOB, and nausea.

## 2017-03-17 NOTE — ED Provider Notes (Signed)
[facility] DEPT Provider Note   CSN: [id number] Arrival date & time: 03/17/17  1810     History   Chief Complaint Chief Complaint  Patient presents with  . Cancer  . Multiple Complaints    HPI Sheila Perkins is a 51 y.o. female.  HPI Patient presents with fatigue, generalized weakness, shortness of breath, nonproductive cough for the past 2-3 days. She's having some right-sided thoracic back and right upper abdominal pain. 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 She denies any fever or chills. States she called her oncologist. And was told that chemotherapy agents that she is on Call some of her symptoms. Advised her to go to the emergency department for evaluation. Past Medical History:  Diagnosis Date  . Cancer ([place])   . Hypertension   . Liver cancer ([place])   . Liver cancer ([place])   . Tumor of rectum     Patient Active Problem List   Diagnosis Date Noted  . 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 Anxiety disorder 07/01/2015  . Neck pain 07/01/2015  . 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 Anemia, iron deficiency 04/06/2014  . Chemical conjunctivitis of left eye 04/26/2013  . Acid reflux 03/03/2013  . Gastrointestinal ulcer due to Helicobacter pylori 05/13/8526  . Neuroendocrine tumor 01/29/2013  . Functional constipation 01/29/2013  . Pelvic mass in female 01/05/2013    Past Surgical History:  Procedure Laterality Date  . FOOT SURGERY    . TUMOR REMOVAL     from rectum area 02/2013    OB History    No data available       Home Medications    Prior to Admission medications   Medication Sig Start Date End Date Taking? Authorizing Provider  amphetamine-dextroamphetamine (ADDERALL) 20 MG tablet Take 20 mg by mouth 2 (two) times daily.   Yes [provider]  escitalopram (LEXAPRO) 10 MG tablet Take 10 mg by mouth at bedtime.  03/08/17  Yes [provider]  LORazepam (ATIVAN) 0.5 MG tablet Take 0.5 mg by mouth at bedtime.  02/15/17  Yes [provider]  losartan (COZAAR) 100 MG tablet Take 100 mg by mouth daily. 10/12/16  Yes [provider]  medroxyPROGESTERone (DEPO-PROVERA) 150 MG/ML injection Inject 150 mg into the muscle every 3 (three) months.  03/16/17  Yes [provider]  omeprazole (PRILOSEC) 40 MG capsule Take 40 mg by mouth daily.  02/18/17  Yes [provider]  ondansetron (ZOFRAN) 8 MG tablet Take 8 mg by mouth every 8 (eight) hours as needed for nausea or vomiting.  01/29/17  Yes [provider]  benzonatate (TESSALON) 100 MG capsule Take 1 capsule (100 mg total) by mouth 3 (three) times daily as needed for cough. 03/17/17   Julianne Rice, MD  cephALEXin (KEFLEX) 500 MG capsule Take 1 capsule (500 mg total) by mouth 3 (three) times daily. 03/17/17   Julianne Rice, MD  hydrocortisone 2.5 % ointment Apply 1 application topically 2 (two) times daily.  03/16/17   [provider]  ibuprofen (ADVIL,MOTRIN) 600 MG tablet Take 1 tablet (600 mg total) by mouth every 6 (six) hours as needed. Patient not taking: Reported on 01/10/2017 08/20/16   [name], MD  losartan (COZAAR) 50 MG tablet Take 1 tablet (50 mg total) by mouth daily. Patient not taking: Reported on 01/10/2017 06/11/15   Janne Napoleon, NP  LYRICA 50 MG capsule Take 50 mg by mouth  2 (two) times daily as needed (pain).  06/25/14   [provider]    Family History Family History  Problem Relation Age of Onset  . Cancer Mother        pancreatic    Social History Social History  Substance Use Topics  . Smoking status: Never Smoker  . Smokeless tobacco: Never Used  . Alcohol use No     Allergies   Codeine; Hydrocodone; Oxycodone; Percocet [oxycodone-acetaminophen]; and Tramadol   Review of Systems Review of Systems  Constitutional: Positive for activity change and fatigue. Negative for chills  and fever.  HENT: Negative for congestion, sore throat and trouble swallowing.   Eyes: Negative for visual disturbance.  Respiratory: Positive for cough and shortness of breath. Negative for chest tightness.   Cardiovascular: Negative for chest pain, palpitations and leg swelling.  Gastrointestinal: Positive for abdominal distention, abdominal pain, constipation and nausea. Negative for diarrhea and vomiting.  Genitourinary: Negative for dysuria, flank pain and frequency.  Musculoskeletal: Positive for back pain. Negative for myalgias, neck pain and neck stiffness.  Skin: Negative for rash and wound.  Neurological: Positive for weakness (generalized). Negative for dizziness, light-headedness, numbness and headaches.  All other systems reviewed and are negative.    Physical Exam Updated Vital Signs BP 119/77   Pulse 87   Temp 98.9 F (37.2 C) (Oral)   Resp 19   Ht 5\' 6"  (1.676 m)   Wt 63.5 kg (140 lb)   SpO2 99%   BMI 22.60 kg/m   Physical Exam  Constitutional: She is oriented to person, place, and time. She appears well-developed and well-nourished. No distress.  HENT:  Head: Normocephalic and atraumatic.  Mouth/Throat: Oropharynx is clear and moist. No oropharyngeal exudate.  Eyes: Pupils are equal, round, and reactive to light. EOM are normal.  Neck: Normal range of motion. Neck supple. No JVD present.  No meningismus  Cardiovascular: Normal rate and regular rhythm.  Exam reveals no gallop and no friction rub.   No murmur heard. Pulmonary/Chest: Effort normal. No respiratory distress. She has no wheezes. She exhibits no tenderness.  Diminished breath sounds in the right base.  Abdominal: Soft. Bowel sounds are normal. She exhibits distension. There is tenderness. There is no rebound and no guarding.  Abdomen is distended with marked elevation of the liver and the right upper quadrant. She has right upper quadrant, epigastric and left upper quadrant tenderness to palpation.  There is no rebound or guarding. Diminished bowel sounds throughout.  Musculoskeletal: Normal range of motion. She exhibits no edema or tenderness.  No lower extremity swelling, asymmetry or tenderness. Distal pulses are 2+.  Neurological: She is alert and oriented to person, place, and time.  5/5 motor in all extremities. Sensation intact.  Skin: Skin is warm and dry. Capillary refill takes less than 2 seconds. No rash noted. No erythema.  Psychiatric: She has a normal mood and affect. Her behavior is normal.  Nursing note and vitals reviewed.    ED Treatments / Results  Labs (all labs ordered are listed, but only abnormal results are displayed) Labs Reviewed  URINE CULTURE - Abnormal; Notable for the following:       Result Value   Culture MULTIPLE SPECIES PRESENT, SUGGEST RECOLLECTION (*)    All other components within normal limits  COMPREHENSIVE METABOLIC PANEL - Abnormal; Notable for the following:    Chloride 99 (*)    Creatinine, Ser 1.03 (*)    Albumin 3.2 (*)    AST 90 (*)  Alkaline Phosphatase 562 (*)    All other components within normal limits  URINALYSIS, ROUTINE W REFLEX MICROSCOPIC - Abnormal; Notable for the following:    Color, Urine AMBER (*)    APPearance CLOUDY (*)    Hgb urine dipstick SMALL (*)    Leukocytes, UA LARGE (*)    Bacteria, UA FEW (*)    Squamous Epithelial / LPF 6-30 (*)    All other components within normal limits  CBC WITH DIFFERENTIAL/PLATELET - Abnormal; Notable for the following:    WBC 14.8 (*)    Hemoglobin 11.6 (*)    HCT 34.8 (*)    MCV 77.9 (*)    Platelets 405 (*)    Neutro Abs 10.7 (*)    Monocytes Absolute 2.1 (*)    All other components within normal limits  TROPONIN I - Abnormal; Notable for the following:    Troponin I 0.03 (*)    All other components within normal limits  BRAIN NATRIURETIC PEPTIDE  LIPASE, BLOOD  CBC WITH DIFFERENTIAL/PLATELET  I-STAT CG4 LACTIC ACID, ED  I-STAT TROPONIN, ED  CG4 I-STAT (LACTIC  ACID)  I-STAT TROPONIN, ED    EKG  EKG Interpretation  Date/Time:  Wednesday March 17 2017 18:37:39 EDT Ventricular Rate:  109 PR Interval:    QRS Duration: 86 QT Interval:  341 QTC Calculation: 460 R Axis:   61 Text Interpretation:  Sinus tachycardia Low voltage, precordial leads Borderline repolarization abnormality Confirmed by Lita Mains  MD, Maliah Pyles ([id number]) on 03/17/2017 8:10:50 PM Also confirmed by Lita Mains  MD, Avonda Toso ([id number]), editor Hattie Perch ([id number])  on 03/18/2017 7:36:14 AM       Radiology No results found.  Procedures Procedures (including critical care time)  Medications Ordered in ED Medications  sodium chloride 0.9 % bolus 1,000 mL (0 mLs Intravenous Stopped 03/17/17 2251)  sodium chloride 0.9 % bolus 1,000 mL (0 mLs Intravenous Stopped 03/18/17 0058)  cefTRIAXone (ROCEPHIN) 1 g in dextrose 5 % 50 mL IVPB (0 g Intravenous Stopped 03/18/17 0018)  benzonatate (TESSALON) capsule 100 mg (100 mg Oral Given 03/17/17 2332)     Initial Impression / Assessment and Plan / ED Course  I have reviewed the triage vital signs and the nursing notes.  Pertinent labs & imaging results that were available during my care of the patient were reviewed by me and considered in my medical decision making (see chart for details).     CT with evidence of increased tumor burden to the liver. She does have some mild left-sided hydronephrosis. She has no CVA tenderness. Denies having any left flank tenderness. Questionable UTI on UA. Urine sent for culture. Will go ahead and start antibiotics. Likely some degree of dehydration. Give IV fluids. Improvement and heart rate and blood pressure. CT without evidence of pulmonary disease. The patient's shortness of breath due to to the enlarged liver. Will reassess after IV fluids. Final Clinical Impressions(s) / ED Diagnoses   Final diagnoses:  Generalized weakness  Cough  Acute lower UTI  Liver masses    New Prescriptions Discharge  Medication List as of 03/17/2017 11:52 PM    START taking these medications   Details  benzonatate (TESSALON) 100 MG capsule Take 1 capsule (100 mg total) by mouth 3 (three) times daily as needed for cough., Starting Wed 03/17/2017, Print    cephALEXin (KEFLEX) 500 MG capsule Take 1 capsule (500 mg total) by mouth 3 (three) times daily., Starting Wed 03/17/2017, Print  Julianne Rice, MD 03/22/17 [phone]

## 2017-03-17 NOTE — ED Notes (Signed)
Bed: WA06 Expected date:  Expected time:  Means of arrival:  Comments: 51 yo F  Constipation

## 2017-03-18 LAB — I-STAT TROPONIN, ED: TROPONIN I, POC: 0 ng/mL (ref 0.00–0.08)

## 2017-03-19 LAB — URINE CULTURE

## 2017-04-24 ENCOUNTER — Emergency Department (HOSPITAL_BASED_OUTPATIENT_CLINIC_OR_DEPARTMENT_OTHER): Payer: Medicare Other

## 2017-04-24 ENCOUNTER — Emergency Department (HOSPITAL_BASED_OUTPATIENT_CLINIC_OR_DEPARTMENT_OTHER)
Admission: EM | Admit: 2017-04-24 | Discharge: 2017-04-24 | Disposition: A | Discharge status: Home / Self Care | Payer: Medicare Other | Attending: Emergency Medicine | Admitting: Emergency Medicine

## 2017-04-24 ENCOUNTER — Encounter (HOSPITAL_BASED_OUTPATIENT_CLINIC_OR_DEPARTMENT_OTHER): Payer: Self-pay | Admitting: *Deleted

## 2017-04-24 DIAGNOSIS — Z85038 Personal history of other malignant neoplasm of large intestine: Secondary | ICD-10-CM | POA: Diagnosis not present

## 2017-04-24 DIAGNOSIS — R1084 Generalized abdominal pain: Secondary | ICD-10-CM | POA: Insufficient documentation

## 2017-04-24 DIAGNOSIS — Z8505 Personal history of malignant neoplasm of liver: Secondary | ICD-10-CM | POA: Diagnosis not present

## 2017-04-24 DIAGNOSIS — K5900 Constipation, unspecified: Secondary | ICD-10-CM | POA: Diagnosis not present

## 2017-04-24 DIAGNOSIS — Z79899 Other long term (current) drug therapy: Secondary | ICD-10-CM | POA: Insufficient documentation

## 2017-04-24 DIAGNOSIS — I10 Essential (primary) hypertension: Secondary | ICD-10-CM | POA: Insufficient documentation

## 2017-04-24 DIAGNOSIS — R140 Abdominal distension (gaseous): Secondary | ICD-10-CM | POA: Diagnosis not present

## 2017-04-24 DIAGNOSIS — R1110 Vomiting, unspecified: Secondary | ICD-10-CM | POA: Insufficient documentation

## 2017-04-24 HISTORY — DX: Malignant neoplasm of colon, unspecified: C18.9

## 2017-04-24 MED ORDER — BISACODYL 10 MG RE SUPP
10.0000 mg | Freq: Once | RECTAL | Status: AC
  Administered 2017-04-24: 10 mg via RECTAL
  Filled 2017-04-24: qty 1

## 2017-04-24 NOTE — ED Notes (Signed)
Patient transported to X-ray. Not in room.

## 2017-04-24 NOTE — ED Provider Notes (Signed)
[facility] DEPT [facility] Provider Note   CSN: [id number] Arrival date & time: 04/24/17  1650     History   Chief Complaint Chief Complaint  Patient presents with  . Constipation    HPI Sheila Perkins is a 51 y.o. female.  Patient with slowly progressive metastatic neuroendocrine tumor (for past 5 years) w progressive liver mets, c/o worsening constipation in past several days. Gradual onset, progressive. Mild abd distension. Did vomit 2 times earlier today. abd feels uncomfortable, diffuse, cramping, intermittent - no constant and/or focal abd pain. No fever or chills. Urinating of normal frequency. No dysuria. Denies back or flank pain. Is passing gas. Pt is trying colace and miralax for constipation.  Pt notes even prior to issues w ca, had chronic constipation prior to that w her normal bowel movement pattern being a bm q 4 days or so.  Only recent change in med was addition of taking oxycodone for past in past 3 days.    The history is provided by the patient.  Constipation   Pertinent negatives include no dysuria.    Past Medical History:  Diagnosis Date  . Cancer ([place])   . Colon cancer ([place])   . Hypertension   . Liver cancer ([place])   . Liver cancer ([place])   . Tumor of rectum     Patient Active Problem List   Diagnosis Date Noted  . 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 Anxiety disorder 07/01/2015  . Neck pain 07/01/2015  . 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 Anemia, iron deficiency 04/06/2014  . Chemical conjunctivitis of left eye 04/26/2013  . Acid reflux 03/03/2013  . Gastrointestinal ulcer due to Helicobacter pylori 81/85/6314  . Neuroendocrine tumor 01/29/2013  . Functional constipation 01/29/2013  . Pelvic mass in female 01/05/2013    Past Surgical History:  Procedure Laterality Date  . FOOT SURGERY    . TUMOR REMOVAL     from rectum area 02/2013    OB History    No data available       Home  Medications    Prior to Admission medications   Medication Sig Start Date End Date Taking? Authorizing Provider  amphetamine-dextroamphetamine (ADDERALL) 20 MG tablet Take 20 mg by mouth 2 (two) times daily.   Yes [provider]  escitalopram (LEXAPRO) 10 MG tablet Take 10 mg by mouth at bedtime.  03/08/17  Yes [provider]  LORazepam (ATIVAN) 0.5 MG tablet Take 0.5 mg by mouth at bedtime.  02/15/17  Yes [provider]  losartan (COZAAR) 100 MG tablet Take 100 mg by mouth daily. 10/12/16  Yes [provider]  losartan (COZAAR) 50 MG tablet Take 1 tablet (50 mg total) by mouth daily. 06/11/15  Yes Mabe, Shanon Brow, NP  LYRICA 50 MG capsule Take 50 mg by mouth 2 (two) times daily as needed (pain).  06/25/14  Yes [provider]  medroxyPROGESTERone (DEPO-PROVERA) 150 MG/ML injection Inject 150 mg into the muscle every 3 (three) months.  03/16/17  Yes [provider]  omeprazole (PRILOSEC) 40 MG capsule Take 40 mg by mouth daily.  02/18/17  Yes [provider]  ondansetron (ZOFRAN) 8 MG tablet Take 8 mg by mouth every 8 (eight) hours as needed for nausea or vomiting.  01/29/17  Yes [provider]  oxycodone (OXY-IR) 5 MG capsule Take 5 mg by mouth every 6 (six) hours as needed.   Yes [provider]  benzonatate (TESSALON) 100 MG capsule Take  1 capsule (100 mg total) by mouth 3 (three) times daily as needed for cough. 03/17/17   Julianne Rice, MD  cephALEXin (KEFLEX) 500 MG capsule Take 1 capsule (500 mg total) by mouth 3 (three) times daily. 03/17/17   Julianne Rice, MD  hydrocortisone 2.5 % ointment Apply 1 application topically 2 (two) times daily.  03/16/17   [provider]  ibuprofen (ADVIL,MOTRIN) 600 MG tablet Take 1 tablet (600 mg total) by mouth every 6 (six) hours as needed. Patient not taking: Reported on 01/10/2017 08/20/16   [name], MD    Family History Family History  Problem Relation Age of  Onset  . Cancer Mother        pancreatic    Social History Social History  Substance Use Topics  . Smoking status: Never Smoker  . Smokeless tobacco: Never Used  . Alcohol use No     Allergies   Codeine; Hydrocodone; Oxycodone; Percocet [oxycodone-acetaminophen]; and Tramadol   Review of Systems Review of Systems  Constitutional: Negative for chills and fever.  HENT: Negative for sore throat.   Eyes: Negative for redness.  Respiratory: Negative for shortness of breath.   Cardiovascular: Negative for chest pain.  Gastrointestinal: Positive for constipation.  Genitourinary: Negative for dysuria.  Musculoskeletal: Negative for back pain.  Skin: Negative for rash.  Neurological: Negative for headaches.  Hematological: Does not bruise/bleed easily.  Psychiatric/Behavioral: Negative for confusion.     Physical Exam Updated Vital Signs BP (!) 115/98 (BP Location: Left Arm)   Pulse (!) 112   Temp 98.6 F (37 C) (Oral)   Resp 18   Ht 1.651 m (5\' 5" )   Wt 65.8 kg (145 lb)   LMP 03/03/2017 (Approximate)   SpO2 98%   BMI 24.13 kg/m   Physical Exam  Constitutional: She appears well-developed and well-nourished. No distress.  HENT:  Mouth/Throat: Oropharynx is clear and moist.  Eyes: Conjunctivae are normal. No scleral icterus.  Neck: Neck supple. No tracheal deviation present.  Cardiovascular: Normal rate, regular rhythm, normal heart sounds and intact distal pulses.  Exam reveals no gallop and no friction rub.   No murmur heard. Pulmonary/Chest: Effort normal and breath sounds normal. No respiratory distress.  Abdominal: Soft. Normal appearance and bowel sounds are normal. There is no tenderness. There is no rebound and no guarding.  Markedly enlarged liver.   Genitourinary:  Genitourinary Comments: No cva tenderness. Stool mod-large amount soft formed stool.   Musculoskeletal: She exhibits no edema.  Neurological: She is alert.  Skin: Skin is warm and dry. No rash  noted. She is not diaphoretic.  Psychiatric: She has a normal mood and affect.  Nursing note and vitals reviewed.    ED Treatments / Results  Labs (all labs ordered are listed, but only abnormal results are displayed) Labs Reviewed - No data to display  EKG  EKG Interpretation None       Radiology Dg Abd 1 View  Result Date: 04/24/2017 CLINICAL DATA:  51 year old female with history of abdominal distention and constipation. EXAM: ABDOMEN - 1 VIEW COMPARISON:  No priors. FINDINGS: Gas and stool are noted throughout the colon and distal rectum. No pathologic dilatation of small bowel or colon. Relatively large volume of stool noted throughout the colon and rectum. Hepatic silhouette appears massively enlarged. Numerous surgical clips throughout the pelvis, presumably from prior lymph node dissection. No pneumoperitoneum noted on the single supine view. IMPRESSION: 1. Nonobstructive bowel gas pattern. 2. Large volume of well-formed stool throughout the  colon and rectum compatible with the reported clinical history of constipation. 3. Massively enlarged hepatic silhouette compatible with known metastatic disease to liver. Electronically Signed   By: Vinnie Langton M.D.   On: 04/24/2017 17:54    Procedures Procedures (including critical care time)  Medications Ordered in ED Medications  bisacodyl (DULCOLAX) suppository 10 mg (10 mg Rectal Given 04/24/17 1746)     Initial Impression / Assessment and Plan / ED Course  I have reviewed the triage vital signs and the nursing notes.  Pertinent labs & imaging results that were available during my care of the patient were reviewed by me and considered in my medical decision making (see chart for details).  kub - no obstruction. abd is soft non tender.  No emesis in ED.  Likely recent needing/taking opiate pain med contributes to increasing problem w constipation.   Dulcolax supp.  Discussed increase po fluids/water, fiber in diet,  continuing colace and miralax, adding enemas prn.     Final Clinical Impressions(s) / ED Diagnoses   Final diagnoses:  None    New Prescriptions New Prescriptions   No medications on file     Lajean Saver, MD 04/24/17 [phone]

## 2017-04-24 NOTE — Discharge Instructions (Signed)
It was our pleasure to provide your ER care today - we hope that you feel better.  For constipation, drink adequate water/fluids and ensure adequate fiber in diet, continue to take the colace 2x/day and miralax as need.  You may also try magnesium citrate, and use fleets enemas as need.   Also, opiate type medication such as the oxycodone will make constipation worse.   Try taking acetaminophen and ibuprofen as need for pain, and reserve the oxycodone if both the acetaminophen/ibuprofen fail to improve your pain.   Follow up with your doctor Monday for recheck.  Return to ER if worse, new symptoms, fevers, worsening or severe abdominal pain, persistent vomiting, other concern.

## 2017-04-24 NOTE — ED Notes (Signed)
PT in restroom at this time

## 2017-04-24 NOTE — ED Notes (Signed)
Pt has not been able to have a BM as of yet. Requesting to have a few more minutes.

## 2017-04-24 NOTE — ED Notes (Signed)
Returned from [facility]. Continues to c/o some mild nausea. Pt states she will let us know if she needs something.

## 2017-04-24 NOTE — ED Triage Notes (Signed)
Pt reports having colon and liver cancer (care provided at [facility]). Reports issues with constipation x2wks (tried enema last night unsuccessfully). Reports she's not taking chemo/radiation at this time. Also reports n/v. Denies fever.

## 2017-05-09 ENCOUNTER — Ambulatory Visit (HOSPITAL_COMMUNITY)
Admission: EM | Admit: 2017-05-09 | Discharge: 2017-05-09 | Disposition: A | Discharge status: Home / Self Care | Payer: Medicare Other | Attending: Internal Medicine | Admitting: Internal Medicine

## 2017-05-09 ENCOUNTER — Emergency Department (HOSPITAL_COMMUNITY): Payer: Medicare Other

## 2017-05-09 ENCOUNTER — Emergency Department (HOSPITAL_COMMUNITY)
Admission: EM | Admit: 2017-05-09 | Discharge: 2017-05-09 | Disposition: A | Discharge status: Home / Self Care | Payer: Medicare Other | Attending: Emergency Medicine | Admitting: Emergency Medicine

## 2017-05-09 ENCOUNTER — Other Ambulatory Visit: Payer: Self-pay

## 2017-05-09 ENCOUNTER — Encounter (HOSPITAL_COMMUNITY): Payer: Self-pay | Admitting: Emergency Medicine

## 2017-05-09 ENCOUNTER — Encounter (HOSPITAL_COMMUNITY): Payer: Self-pay | Admitting: *Deleted

## 2017-05-09 DIAGNOSIS — R079 Chest pain, unspecified: Secondary | ICD-10-CM

## 2017-05-09 DIAGNOSIS — R0789 Other chest pain: Secondary | ICD-10-CM | POA: Insufficient documentation

## 2017-05-09 DIAGNOSIS — Z8505 Personal history of malignant neoplasm of liver: Secondary | ICD-10-CM | POA: Diagnosis not present

## 2017-05-09 DIAGNOSIS — Z85038 Personal history of other malignant neoplasm of large intestine: Secondary | ICD-10-CM | POA: Diagnosis not present

## 2017-05-09 DIAGNOSIS — R000 Tachycardia, unspecified: Secondary | ICD-10-CM

## 2017-05-09 DIAGNOSIS — I10 Essential (primary) hypertension: Secondary | ICD-10-CM | POA: Insufficient documentation

## 2017-05-09 DIAGNOSIS — R071 Chest pain on breathing: Secondary | ICD-10-CM | POA: Diagnosis not present

## 2017-05-09 DIAGNOSIS — C7A1 Malignant poorly differentiated neuroendocrine tumors: Secondary | ICD-10-CM | POA: Diagnosis not present

## 2017-05-09 DIAGNOSIS — C7A8 Other malignant neuroendocrine tumors: Secondary | ICD-10-CM

## 2017-05-09 DIAGNOSIS — Z79899 Other long term (current) drug therapy: Secondary | ICD-10-CM | POA: Diagnosis not present

## 2017-05-09 LAB — BASIC METABOLIC PANEL
Anion gap: 8 (ref 5–15)
BUN: 11 mg/dL (ref 6–20)
CO2: 25 mmol/L (ref 22–32)
CREATININE: 0.84 mg/dL (ref 0.44–1.00)
Calcium: 8.2 mg/dL — ABNORMAL LOW (ref 8.9–10.3)
Chloride: 102 mmol/L (ref 101–111)
GFR calc Af Amer: >60 mL/min (ref >60)
Glucose, Bld: 101 mg/dL — ABNORMAL HIGH (ref 65–99)
Potassium: 3.7 mmol/L (ref 3.5–5.1)
SODIUM: 135 mmol/L (ref 135–145)

## 2017-05-09 LAB — CBC
HCT: 37.3 % (ref 36.0–46.0)
Hemoglobin: 11.9 g/dL — ABNORMAL LOW (ref 12.0–15.0)
MCH: 24.3 pg — ABNORMAL LOW (ref 26.0–34.0)
MCHC: 31.9 g/dL (ref 30.0–36.0)
MCV: 76.3 fL — AB (ref 78.0–100.0)
PLATELETS: 558 10*3/uL — AB (ref 150–400)
RBC: 4.89 MIL/uL (ref 3.87–5.11)
RDW: 17.2 % — AB (ref 11.5–15.5)
WBC: 11 10*3/uL — AB (ref 4.0–10.5)

## 2017-05-09 LAB — I-STAT TROPONIN, ED
TROPONIN I, POC: 0 ng/mL (ref 0.00–0.08)
Troponin i, poc: 0 ng/mL (ref 0.00–0.08)

## 2017-05-09 LAB — I-STAT BETA HCG BLOOD, ED (MC, WL, AP ONLY)

## 2017-05-09 MED ORDER — MORPHINE SULFATE 30 MG PO TABS: 30.0000 mg | ORAL_TABLET | Freq: Four times a day (QID) | ORAL | 0 refills | Status: AC | PRN

## 2017-05-09 MED ORDER — SODIUM CHLORIDE 0.9 % IV BOLUS (SEPSIS)
1000.0000 mL | Freq: Once | INTRAVENOUS | Status: AC
  Administered 2017-05-09: 1000 mL via INTRAVENOUS

## 2017-05-09 MED ORDER — MORPHINE SULFATE (PF) 4 MG/ML IV SOLN
4.0000 mg | Freq: Once | INTRAVENOUS | Status: AC
  Administered 2017-05-09: 4 mg via INTRAVENOUS
  Filled 2017-05-09: qty 1

## 2017-05-09 MED ORDER — IOPAMIDOL (ISOVUE-370) INJECTION 76%
INTRAVENOUS | Status: AC
  Administered 2017-05-09: 100 mL via INTRAVENOUS
  Filled 2017-05-09: qty 100

## 2017-05-09 NOTE — ED Provider Notes (Signed)
[facility] EMERGENCY DEPARTMENT Provider Note   CSN: [id number] Arrival date & time: 05/09/17  1721     History   Chief Complaint No chief complaint on file.   HPI Sheila Perkins is a 51 y.o. female.  51 year old history of neuroendocrine tumor, HTN who presents from urgent care for chest pain and concern for PE.  Patient describes onset of substernal chest pain radiating underneath left breast since yesterday evening.  Improved by sitting forward.  Worsening with raising left arm.  Denies any previous episodes.  No previous DVT or PE.  History significant for neuroendocrine tumor for which she received chemotherapy up until 3 months ago.  Chemotherapy was paused as her tumor was growing.  She is currently being evaluated for radiation therapy.  She has chronic distention of her abdomen due to multiple palpable masses.  Endorses discomfort and pain in her epigastric region with the sensation of upward pressure towards her thorax by the tumor  The history is provided by the patient. No language interpreter was used.    Past Medical History:  Diagnosis Date  . Cancer ([place])   . Colon cancer ([place])   . Hypertension   . Liver cancer ([place])   . Liver cancer ([place])   . Tumor of rectum     Patient Active Problem List   Diagnosis Date Noted  . 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 Anxiety disorder 07/01/2015  . Neck pain 07/01/2015  . 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 Anemia, iron deficiency 04/06/2014  . Chemical conjunctivitis of left eye 04/26/2013  . Acid reflux 03/03/2013  . Gastrointestinal ulcer due to Helicobacter pylori 58/03/9832  . Neuroendocrine tumor 01/29/2013  . Functional constipation 01/29/2013  . Pelvic mass in female 01/05/2013    Past Surgical History:  Procedure Laterality Date  . FOOT SURGERY    . TUMOR REMOVAL     from rectum area 02/2013    OB History    No data available        Home Medications    Prior to Admission medications   Medication Sig Start Date End Date Taking? Authorizing Provider  amphetamine-dextroamphetamine (ADDERALL) 20 MG tablet Take 20 mg by mouth 2 (two) times daily.   Yes [provider]  cyclobenzaprine (FLEXERIL) 10 MG tablet Take 10 mg by mouth 3 (three) times daily as needed. 03/18/17  Yes [provider]  escitalopram (LEXAPRO) 10 MG tablet Take 10 mg by mouth daily.  03/08/17  Yes [provider]  hydrochlorothiazide (HYDRODIURIL) 12.5 MG tablet Take 12.5 mg by mouth daily. 03/29/17  Yes [provider]  ibuprofen (ADVIL,MOTRIN) 200 MG tablet Take 400 mg by mouth every 6 (six) hours as needed for headache.   Yes [provider]  LORazepam (ATIVAN) 0.5 MG tablet Take 0.5 mg by mouth daily.  02/15/17  Yes [provider]  losartan (COZAAR) 100 MG tablet Take 100 mg by mouth daily. 10/12/16  Yes [provider]  medroxyPROGESTERone (DEPO-PROVERA) 150 MG/ML injection Inject 150 mg into the muscle every 3 (three) months. Last injection end of July 2018 03/16/17  Yes [provider]  omeprazole (PRILOSEC) 40 MG capsule Take 40 mg by mouth daily.  02/18/17  Yes [provider]  ondansetron (ZOFRAN) 8 MG tablet Take 8 mg by mouth every 8 (eight) hours as needed for nausea or vomiting.  01/29/17  Yes [provider]  oxycodone (OXY-IR) 5 MG capsule Take 2.5 mg by  mouth every 6 (six) hours as needed for pain.    Yes [provider]  pregabalin (LYRICA) 50 MG capsule Take 50 mg by mouth daily as needed (pain).   Yes [provider]  morphine (MSIR) 30 MG tablet Take 1 tablet (30 mg total) by mouth every 6 (six) hours as needed for severe pain. 05/09/17   Payton Emerald, MD    Family History Family History  Problem Relation Age of Onset  . Cancer Mother        pancreatic    Social History Social History  Substance Use Topics  . Smoking status:  Never Smoker  . Smokeless tobacco: Never Used  . Alcohol use No     Allergies   Tramadol; Codeine; Hydrocodone; Oxycodone; and Percocet [oxycodone-acetaminophen]   Review of Systems Review of Systems  Constitutional: Negative for chills and fever.  HENT: Negative for ear pain and sore throat.   Eyes: Negative for pain and visual disturbance.  Respiratory: Negative for cough and shortness of breath.   Cardiovascular: Positive for chest pain. Negative for palpitations.  Gastrointestinal: Positive for abdominal distention (chronic). Negative for abdominal pain and vomiting.  Genitourinary: Negative for dysuria and hematuria.  Musculoskeletal: Negative for arthralgias and back pain.  Skin: Negative for color change and rash.  Neurological: Negative for seizures and syncope.  All other systems reviewed and are negative.    Physical Exam Updated Vital Signs BP 115/82 (BP Location: Right Arm)   Pulse 88   Temp 98.5 F (36.9 C) (Oral)   Resp 15   Ht 5\' 5"  (1.651 m)   Wt 65.8 kg (145 lb)   LMP 04/25/2017 Comment: shielded  SpO2 100%   BMI 24.13 kg/m   Physical Exam  Constitutional: She appears well-developed.  Sitting upright, unable to tolerate laying flat. Appears uncomfortable  HENT:  Head: Normocephalic and atraumatic.  Eyes: Conjunctivae are normal.  Neck: Neck supple.  Cardiovascular: Regular rhythm.  Tachycardia present.   No murmur heard. Pulmonary/Chest: Effort normal and breath sounds normal. No respiratory distress. She has no wheezes. She has no rales.  Abdominal: Soft. She exhibits distension (Chronic distention of abdomen due to mets to liver) and mass (Palpable mass). There is tenderness.  Musculoskeletal: She exhibits no edema.  Neurological: She is alert. No cranial nerve deficit. Coordination normal.  Moves all extremities  Skin: Skin is warm and dry.  Nursing note and vitals reviewed.    ED Treatments / Results  Labs (all labs ordered are listed,  but only abnormal results are displayed) Labs Reviewed  BASIC METABOLIC PANEL - Abnormal; Notable for the following:       Result Value   Glucose, Bld 101 (*)    Calcium 8.2 (*)    All other components within normal limits  CBC - Abnormal; Notable for the following:    WBC 11.0 (*)    Hemoglobin 11.9 (*)    MCV 76.3 (*)    MCH 24.3 (*)    RDW 17.2 (*)    Platelets 558 (*)    All other components within normal limits  I-STAT TROPONIN, ED  I-STAT BETA HCG BLOOD, ED ([facility], [facility], [facility] ONLY)  I-STAT TROPONIN, ED    EKG  EKG Interpretation  Date/Time:  Sunday May 09 2017 17:31:47 EDT Ventricular Rate:  120 PR Interval:  132 QRS Duration: 80 QT Interval:  322 QTC Calculation: 455 R Axis:   87 Text Interpretation:  Sinus tachycardia Low voltage QRS Cannot rule out Anterior  infarct , age undetermined Abnormal ECG No STEMI.  Confirmed by Nanda Quinton [phone]) on 05/09/2017 5:49:02 PM       Radiology Dg Chest 2 View  Result Date: 05/09/2017 CLINICAL DATA:  Initial evaluation for acute chest pain, shortness breath, tachycardia. EXAM: CHEST  2 VIEW COMPARISON:  Prior radiograph from 03/17/2017. FINDINGS: The cardiac and mediastinal silhouettes are stable in size and contour, and remain within normal limits. The lungs are hypoinflated with elevation of the right hemidiaphragm, similar to previous. No airspace consolidation, pleural effusion, or pulmonary edema is identified. There is no pneumothorax. No acute osseous abnormality identified. IMPRESSION: 1. No active cardiopulmonary disease. 2. Chronic elevation of the right hemidiaphragm, stable. Electronically Signed   By: Jeannine Boga M.D.   On: 05/09/2017 19:06   Ct Angio Chest Pe W And/or Wo Contrast  Result Date: 05/09/2017 CLINICAL DATA:  Constant chest pain since yesterday with dyspnea. Pain is located in the mid sternum and beneath left breast. 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 EXAM: CT ANGIOGRAPHY  CHEST CT ABDOMEN AND PELVIS WITH CONTRAST TECHNIQUE: Multidetector CT imaging of the chest was performed using the standard protocol during bolus administration of intravenous contrast. Multiplanar CT image reconstructions and MIPs were obtained to evaluate the vascular anatomy. Multidetector CT imaging of the abdomen and pelvis was performed using the standard protocol during bolus administration of intravenous contrast. CONTRAST:  100 cc Isovue 370 IV COMPARISON:  CT abdomen and pelvis from 03/17/2017 FINDINGS: CTA CHEST FINDINGS Cardiovascular:  Normal branch pattern of the great vessels. Opacification of the thoracic aorta and pulmonary arteries without aortic aneurysm, dissection or pulmonary embolus. Heart is normal in size without pericardial effusion or thickening. Mediastinum/Nodes: No enlarged mediastinal, hilar, or axillary lymph nodes. Thyroid gland, trachea, and esophagus demonstrate no significant findings. Lungs/Pleura: Right basilar atelectasis. No dominant mass or pneumonic consolidation. No effusion or pneumothorax. Musculoskeletal: No chest wall abnormality. No acute or significant osseous findings. Review of the MIP images confirms the above findings. CT ABDOMEN and PELVIS FINDINGS Hepatobiliary: Marked hepatomegaly is re- demonstrated with central liver mass measuring 13.3 x 13.5 x 11.8 cm with central stellate hypodensity. Innumerable hypodensities are seen elsewhere throughout the liver as before. No biliary dilatation. The gallbladder is not definitively identified. Pancreas: Atrophic pancreas without inflammation. Spleen: Heterogeneous enhancement of the spleen.  No splenomegaly. Adrenals/Urinary Tract: Normal bilateral adrenal glands. Symmetric enhancement of both kidneys. Moderate left-sided hydronephrosis and hydroureter. No obstructive uropathy is noted. The bladder is nondistended in appearance. Stomach/Bowel: Nondistended stomach. Normal small bowel rotation without acute inflammation  or obstruction. Moderate colonic stool burden without obstruction. Vascular/Lymphatic: Nonaneurysmal aorta. The hypodense peripancreatic nodular abnormality seen previously near the tail is less apparent on current exam. Small 8 mm short axis nodule in the left ischiorectal fossa fat. Small metastatic lymph node is not excluded. Reproductive: Uterus and bilateral adnexa are unremarkable. Other: New small volume of ascites adjacent to the liver and within the cul-de-sac. Musculoskeletal: No acute or significant osseous findings. Review of the MIP images confirms the above findings. IMPRESSION: 1. No acute pulmonary embolus, aortic aneurysm or dissection. 2. Right lower lobe atelectasis. No active pulmonary disease or mass. 3. Mass of hepatomegaly with innumerable hypodense lesions again re- demonstrated with large central 13.3 x 13.5 x 11.8 cm pole hepatic mass. Findings likely represent stigmata of reported metastatic neuroendocrine tumor to the liver. 4. Interval development of small volume of ascites adjacent to the liver and within the cul-de-sac. 5. Small left perirectal/ischiorectal fossa  fat nodule possibly metastatic lymph node measuring 8 mm short axis. Electronically Signed   By: Ashley Royalty M.D.   On: 05/09/2017 21:59   Ct Abdomen Pelvis W Contrast  Result Date: 05/09/2017 CLINICAL DATA:  Constant chest pain since yesterday with dyspnea. Pain is located in the mid sternum and beneath left breast. 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 EXAM: CT ANGIOGRAPHY CHEST CT ABDOMEN AND PELVIS WITH CONTRAST TECHNIQUE: Multidetector CT imaging of the chest was performed using the standard protocol during bolus administration of intravenous contrast. Multiplanar CT image reconstructions and MIPs were obtained to evaluate the vascular anatomy. Multidetector CT imaging of the abdomen and pelvis was performed using the standard protocol during bolus administration of intravenous contrast. CONTRAST:   100 cc Isovue 370 IV COMPARISON:  CT abdomen and pelvis from 03/17/2017 FINDINGS: CTA CHEST FINDINGS Cardiovascular:  Normal branch pattern of the great vessels. Opacification of the thoracic aorta and pulmonary arteries without aortic aneurysm, dissection or pulmonary embolus. Heart is normal in size without pericardial effusion or thickening. Mediastinum/Nodes: No enlarged mediastinal, hilar, or axillary lymph nodes. Thyroid gland, trachea, and esophagus demonstrate no significant findings. Lungs/Pleura: Right basilar atelectasis. No dominant mass or pneumonic consolidation. No effusion or pneumothorax. Musculoskeletal: No chest wall abnormality. No acute or significant osseous findings. Review of the MIP images confirms the above findings. CT ABDOMEN and PELVIS FINDINGS Hepatobiliary: Marked hepatomegaly is re- demonstrated with central liver mass measuring 13.3 x 13.5 x 11.8 cm with central stellate hypodensity. Innumerable hypodensities are seen elsewhere throughout the liver as before. No biliary dilatation. The gallbladder is not definitively identified. Pancreas: Atrophic pancreas without inflammation. Spleen: Heterogeneous enhancement of the spleen.  No splenomegaly. Adrenals/Urinary Tract: Normal bilateral adrenal glands. Symmetric enhancement of both kidneys. Moderate left-sided hydronephrosis and hydroureter. No obstructive uropathy is noted. The bladder is nondistended in appearance. Stomach/Bowel: Nondistended stomach. Normal small bowel rotation without acute inflammation or obstruction. Moderate colonic stool burden without obstruction. Vascular/Lymphatic: Nonaneurysmal aorta. The hypodense peripancreatic nodular abnormality seen previously near the tail is less apparent on current exam. Small 8 mm short axis nodule in the left ischiorectal fossa fat. Small metastatic lymph node is not excluded. Reproductive: Uterus and bilateral adnexa are unremarkable. Other: New small volume of ascites adjacent to  the liver and within the cul-de-sac. Musculoskeletal: No acute or significant osseous findings. Review of the MIP images confirms the above findings. IMPRESSION: 1. No acute pulmonary embolus, aortic aneurysm or dissection. 2. Right lower lobe atelectasis. No active pulmonary disease or mass. 3. Mass of hepatomegaly with innumerable hypodense lesions again re- demonstrated with large central 13.3 x 13.5 x 11.8 cm pole hepatic mass. Findings likely represent stigmata of reported metastatic neuroendocrine tumor to the liver. 4. Interval development of small volume of ascites adjacent to the liver and within the cul-de-sac. 5. Small left perirectal/ischiorectal fossa fat nodule possibly metastatic lymph node measuring 8 mm short axis. Electronically Signed   By: Ashley Royalty M.D.   On: 05/09/2017 21:59    Procedures Procedures (including critical care time)  Medications Ordered in ED Medications  sodium chloride 0.9 % bolus 1,000 mL (1,000 mLs Intravenous New Bag/Given 05/09/17 2038)  iopamidol (ISOVUE-370) 76 % injection (100 mLs Intravenous Contrast Given 05/09/17 1949)  morphine 4 MG/ML injection 4 mg (4 mg Intravenous Given 05/09/17 2002)     Initial Impression / Assessment and Plan / ED Course  I have reviewed the triage vital signs and the nursing notes.  Pertinent labs & imaging  results that were available during my care of the patient were reviewed by me and considered in my medical decision making (see chart for details).     85yoF h/o metastatic neuroendocrine tumor and HTN who p/w chest pain. Improves with sitting up. AF, tachycardic 120s, otherwise VSS. Lungs CTAB. Abdomen soft, benign throughout.  EKG showing sinus tachycardia. Troponins undetectable x2.   High risk by wells score. CTA PE and CT A/P ordered. No evdience of acute pulmonary embolus. HEAR score of 3. Pt stable for continued outpatient followup. MSIR provided, NCCSRS reviewed.  Return precautions provided for  worsening symptoms. Pt will f/u with PCP at first availability. Pt verbalized agreement with plan.  Pt care d/w Dr. Laverta Baltimore  Final Clinical Impressions(s) / ED Diagnoses   Final diagnoses:  Chest pain, unspecified type  Neuroendocrine carcinoma (HCC)    New Prescriptions New Prescriptions   MORPHINE (MSIR) 30 MG TABLET    Take 1 tablet (30 mg total) by mouth every 6 (six) hours as needed for severe pain.     Payton Emerald, MD 05/10/17 [id number]    Margette Fast, MD 05/10/17 1059

## 2017-05-09 NOTE — ED Notes (Signed)
Pt returned from imaging.

## 2017-05-09 NOTE — ED Triage Notes (Signed)
Pt sent here from UC for chest pain, sob and tachycardia.  Hx of colon ca with mets to liver.  Abdomen distended.

## 2017-05-09 NOTE — ED Notes (Signed)
ED Provider at bedside. 

## 2017-05-09 NOTE — ED Notes (Addendum)
EKG given to Zoe Lan, NP

## 2017-05-09 NOTE — Discharge Instructions (Signed)
Due to intense pain and difficulty breathing due to the pain, recommend go to ER now for further evaluation to rule out PE or other more serious condition.

## 2017-05-09 NOTE — ED Provider Notes (Signed)
[facility]    CSN: [id number] Arrival date & time: 05/09/17  1612     History   Chief Complaint Chief Complaint  Patient presents with  . Chest Pain    HPI Sheila Perkins is a 51 y.o. female.   51 year old female presents with constant chest pain that started yesterday that is accompanied by shortness of breath. The pain is getting worse and she is unable to lay down due to pain. She denies any distinct trauma. She also has noticed some weakness in her left arm and pain in her chest when lifting her arm. She denies any distinct dizziness, nausea, vomiting or sweating. She has felt more tired and increase in heart rate. She has a history of liver cancer and metastasis. The pain is located mid-sternum and just under left breast. She took an Oxycodone today with no relief in pain.    The history is provided by the patient.    Past Medical History:  Diagnosis Date  . Cancer ([place])   . Colon cancer ([place])   . Hypertension   . Liver cancer ([place])   . Liver cancer ([place])   . Tumor of rectum     Patient Active Problem List   Diagnosis Date Noted  . 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 Anxiety disorder 07/01/2015  . Neck pain 07/01/2015  . 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 Anemia, iron deficiency 04/06/2014  . Chemical conjunctivitis of left eye 04/26/2013  . Acid reflux 03/03/2013  . Gastrointestinal ulcer due to Helicobacter pylori 64/33/2951  . Neuroendocrine tumor 01/29/2013  . Functional constipation 01/29/2013  . Pelvic mass in female 01/05/2013    Past Surgical History:  Procedure Laterality Date  . FOOT SURGERY    . TUMOR REMOVAL     from rectum area 02/2013    OB History    No data available       Home Medications    Prior to Admission medications   Medication Sig Start Date End Date Taking? Authorizing Provider  amphetamine-dextroamphetamine (ADDERALL) 20 MG tablet Take 20 mg by mouth 2  (two) times daily.   Yes [provider]  escitalopram (LEXAPRO) 10 MG tablet Take 10 mg by mouth at bedtime.  03/08/17  Yes [provider]  LORazepam (ATIVAN) 0.5 MG tablet Take 0.5 mg by mouth at bedtime.  02/15/17  Yes [provider]  losartan (COZAAR) 100 MG tablet Take 100 mg by mouth daily. 10/12/16  Yes [provider]  LYRICA 50 MG capsule Take 50 mg by mouth 2 (two) times daily as needed (pain).  06/25/14  Yes [provider]  medroxyPROGESTERone (DEPO-PROVERA) 150 MG/ML injection Inject 150 mg into the muscle every 3 (three) months.  03/16/17  Yes [provider]  omeprazole (PRILOSEC) 40 MG capsule Take 40 mg by mouth daily.  02/18/17  Yes [provider]  ondansetron (ZOFRAN) 8 MG tablet Take 8 mg by mouth every 8 (eight) hours as needed for nausea or vomiting.  01/29/17  Yes [provider]  oxycodone (OXY-IR) 5 MG capsule Take 5 mg by mouth every 6 (six) hours as needed.   Yes [provider]    Family History Family History  Problem Relation Age of Onset  . Cancer Mother        pancreatic    Social History Social History  Substance Use Topics  . Smoking status: Never Smoker  . Smokeless tobacco: Never Used  .  Alcohol use No     Allergies   Codeine; Hydrocodone; Oxycodone; Percocet [oxycodone-acetaminophen]; and Tramadol   Review of Systems Review of Systems  Constitutional: Positive for fatigue. Negative for appetite change, chills, diaphoresis and fever.  HENT: Negative for sore throat and trouble swallowing.   Eyes: Negative for photophobia and visual disturbance.  Respiratory: Positive for chest tightness and shortness of breath. Negative for cough and wheezing.   Cardiovascular: Positive for chest pain and palpitations.  Gastrointestinal: Positive for abdominal pain. Negative for diarrhea, nausea and vomiting.  Musculoskeletal: Positive for arthralgias. Negative for back pain, neck  pain and neck stiffness.  Skin: Negative for color change, rash and wound.  Allergic/Immunologic: Positive for immunocompromised state.  Neurological: Positive for weakness. Negative for dizziness, tremors, seizures, syncope, speech difficulty, light-headedness, numbness and headaches.     Physical Exam Triage Vital Signs ED Triage Vitals  Enc Vitals Group     BP 05/09/17 1617 124/85     Pulse Rate 05/09/17 1617 (!) 113     Resp 05/09/17 1617 20     Temp 05/09/17 1617 97.6 F (36.4 C)     Temp Source 05/09/17 1617 Oral     SpO2 05/09/17 1617 99 %     Weight --      Height --      Head Circumference --      Peak Flow --      Pain Score 05/09/17 1618 8     Pain Loc --      Pain Edu? --      Excl. in [place]? --    No data found.   Updated Vital Signs BP 124/85 (BP Location: Left Arm)   Pulse (!) 113   Temp 97.6 F (36.4 C) (Oral)   Resp 20   LMP 04/25/2017   SpO2 99%   Visual Acuity Right Eye Distance:   Left Eye Distance:   Bilateral Distance:    Right Eye Near:   Left Eye Near:    Bilateral Near:     Physical Exam  Constitutional: She is oriented to person, place, and time. She appears well-developed and well-nourished. No distress.  Sitting comfortably on exam table.  HENT:  Head: Normocephalic and atraumatic.  Mouth/Throat: Oropharynx is clear and moist.  Eyes: Conjunctivae and EOM are normal.  Neck: Normal range of motion. Neck supple.  Cardiovascular: Regular rhythm.  Tachycardia present.   Pulmonary/Chest: Effort normal. No accessory muscle usage. No respiratory distress. She has no decreased breath sounds. She has no wheezes. She has no rhonchi.    Chest pain and slight tenderness  Abdominal: She exhibits distension and ascites.  Musculoskeletal: Normal range of motion.  Neurological: She is alert and oriented to person, place, and time.  Skin: Skin is warm and dry.  Psychiatric: She has a normal mood and affect. Her behavior is normal. Judgment and  thought content normal.     UC Treatments / Results  Labs (all labs ordered are listed, but only abnormal results are displayed) Labs Reviewed - No data to display  EKG  EKG Interpretation None       Radiology No results found.  Procedures ED EKG Date/Time: 05/09/2017 5:39 PM Performed by: Melvyn Neth, Lyndsey Demos BERRY Authorized by: Sherlene Shams   ECG reviewed by ED Physician in the absence of a cardiologist: no   Previous ECG:    Previous ECG:  Compared to current Interpretation:    Interpretation: non-specific   Rate:    ECG rate:  117   ECG rate assessment: tachycardic   Rhythm:    Rhythm: sinus tachycardia   Ectopy:    Ectopy: none   Conduction:    Conduction: normal   T waves:    T waves: non-specific     (including critical care time)  Medications Ordered in UC Medications - No data to display   Initial Impression / Assessment and Plan / UC Course  I have reviewed the triage vital signs and the nursing notes.  Pertinent labs & imaging results that were available during my care of the patient were reviewed by me and considered in my medical decision making (see chart for details).    Discussed with patient that since her pain has continued and having shortness of breath, she should be evaluated further, especially to rule out a PE or more serious condition. Recommend further evaluation at the ER now. Patient agrees and will go to the ER now.   Final Clinical Impressions(s) / UC Diagnoses   Final diagnoses:  Chest pain on breathing  Tachycardia    New Prescriptions Discharge Medication List as of 05/09/2017  5:10 PM       Controlled Substance Prescriptions [place] Controlled Substance Registry consulted? Not Applicable   Katy Apo, NP 05/09/17 [phone]

## 2017-05-09 NOTE — ED Triage Notes (Signed)
Pt c/o constant CP onset yest associated w/SOB  Sts pain increases w/activity and breathing  Denies inj/trauma.   A&O x4... NAD... Ambulatory

## 2017-05-09 NOTE — ED Notes (Signed)
Patient transported to CT 

## 2017-05-09 NOTE — Discharge Instructions (Signed)
Please followup closely with [facility] for continued oncology evaluation. Return to ED for worsening symptoms.

## 2017-05-13 ENCOUNTER — Emergency Department (HOSPITAL_COMMUNITY)
Admission: EM | Admit: 2017-05-13 | Discharge: 2017-05-13 | Disposition: A | Discharge status: Home / Self Care | Payer: Medicare Other | Attending: Emergency Medicine | Admitting: Emergency Medicine

## 2017-05-13 ENCOUNTER — Encounter (HOSPITAL_COMMUNITY): Payer: Self-pay

## 2017-05-13 ENCOUNTER — Emergency Department (HOSPITAL_COMMUNITY): Payer: Medicare Other

## 2017-05-13 DIAGNOSIS — R079 Chest pain, unspecified: Secondary | ICD-10-CM | POA: Insufficient documentation

## 2017-05-13 DIAGNOSIS — Z8505 Personal history of malignant neoplasm of liver: Secondary | ICD-10-CM | POA: Diagnosis not present

## 2017-05-13 DIAGNOSIS — Z85038 Personal history of other malignant neoplasm of large intestine: Secondary | ICD-10-CM | POA: Diagnosis not present

## 2017-05-13 DIAGNOSIS — Z79899 Other long term (current) drug therapy: Secondary | ICD-10-CM | POA: Diagnosis not present

## 2017-05-13 DIAGNOSIS — I10 Essential (primary) hypertension: Secondary | ICD-10-CM | POA: Diagnosis not present

## 2017-05-13 DIAGNOSIS — R0602 Shortness of breath: Secondary | ICD-10-CM | POA: Diagnosis not present

## 2017-05-13 DIAGNOSIS — F909 Attention-deficit hyperactivity disorder, unspecified type: Secondary | ICD-10-CM | POA: Insufficient documentation

## 2017-05-13 DIAGNOSIS — Z91040 Latex allergy status: Secondary | ICD-10-CM | POA: Diagnosis not present

## 2017-05-13 LAB — BASIC METABOLIC PANEL
ANION GAP: 14 (ref 5–15)
BUN: 17 mg/dL (ref 6–20)
CALCIUM: 8.6 mg/dL — AB (ref 8.9–10.3)
CO2: 24 mmol/L (ref 22–32)
Chloride: 101 mmol/L (ref 101–111)
Creatinine, Ser: 0.71 mg/dL (ref 0.44–1.00)
GFR calc Af Amer: >60 mL/min (ref >60)
Glucose, Bld: 100 mg/dL — ABNORMAL HIGH (ref 65–99)
POTASSIUM: 4.1 mmol/L (ref 3.5–5.1)
SODIUM: 139 mmol/L (ref 135–145)

## 2017-05-13 LAB — CBC
HCT: 39.3 % (ref 36.0–46.0)
Hemoglobin: 12.6 g/dL (ref 12.0–15.0)
MCH: 24.7 pg — AB (ref 26.0–34.0)
MCHC: 32.1 g/dL (ref 30.0–36.0)
MCV: 77.1 fL — ABNORMAL LOW (ref 78.0–100.0)
PLATELETS: 604 10*3/uL — AB (ref 150–400)
RBC: 5.1 MIL/uL (ref 3.87–5.11)
RDW: 17.3 % — ABNORMAL HIGH (ref 11.5–15.5)
WBC: 12.3 10*3/uL — ABNORMAL HIGH (ref 4.0–10.5)

## 2017-05-13 LAB — POCT I-STAT TROPONIN I
TROPONIN I, POC: 0 ng/mL (ref 0.00–0.08)
Troponin i, poc: 0 ng/mL (ref 0.00–0.08)

## 2017-05-13 LAB — BRAIN NATRIURETIC PEPTIDE: B NATRIURETIC PEPTIDE 5: 30.1 pg/mL (ref 0.0–100.0)

## 2017-05-13 LAB — I-STAT BETA HCG BLOOD, ED (NOT ORDERABLE): I-stat hCG, quantitative: <5 m[IU]/mL (ref <5)

## 2017-05-13 MED ORDER — SODIUM CHLORIDE 0.9 % IV BOLUS (SEPSIS)
500.0000 mL | Freq: Once | INTRAVENOUS | Status: AC
  Administered 2017-05-13: 500 mL via INTRAVENOUS

## 2017-05-13 MED ORDER — ONDANSETRON HCL 4 MG/2ML IJ SOLN
4.0000 mg | Freq: Once | INTRAMUSCULAR | Status: AC
  Administered 2017-05-13: 4 mg via INTRAVENOUS
  Filled 2017-05-13: qty 2

## 2017-05-13 MED ORDER — ONDANSETRON HCL 4 MG PO TABS: 4.0000 mg | ORAL_TABLET | Freq: Three times a day (TID) | ORAL | 0 refills | Status: AC | PRN

## 2017-05-13 MED ORDER — IBUPROFEN 200 MG PO TABS
600.0000 mg | ORAL_TABLET | Freq: Once | ORAL | Status: AC
  Administered 2017-05-13: 600 mg via ORAL
  Filled 2017-05-13: qty 3

## 2017-05-13 MED ORDER — KETOROLAC TROMETHAMINE 30 MG/ML IJ SOLN
30.0000 mg | Freq: Once | INTRAMUSCULAR | Status: AC
  Administered 2017-05-13: 30 mg via INTRAVENOUS
  Filled 2017-05-13: qty 1

## 2017-05-13 MED ORDER — IBUPROFEN 600 MG PO TABS: 600.0000 mg | ORAL_TABLET | Freq: Four times a day (QID) | ORAL | 0 refills | Status: AC | PRN

## 2017-05-13 NOTE — ED Notes (Signed)
Pt verbalized understanding of d/c instructions, follow up care, and prescriptions. A/Ox4, ambulatory. All belongings with patient upon departure.  

## 2017-05-13 NOTE — ED Provider Notes (Signed)
[facility] DEPT Provider Note   CSN: [id number] Arrival date & time: 05/13/17  1144     History   Chief Complaint Chief Complaint  Patient presents with  . Chest Pain  . Shortness of Breath  . Leg Swelling    HPI Sheila Perkins is a 51 y.o. female with a history of neuroendocrine tumor, HTN who presents to the ED today for continued chest pain. Patient was seen her on 05/09/17 and had negative Tn x 2 and negative CTA for PE, aortic aneurysm or dissection. Patient abdominal CT showed mass of hepatomegaly from metastatic neuroendocrine tumor of the liver. Patient with onset of pain on 05/08/2017 which was a 8/10 and described as a substernal chest pain with radiation underneath the left breast.  The pain is improved by sitting forward and worsened with lying back.  Chest pain is nonexertional.  Pain has not changed in characteristic since onset. Denies radiation to left arm, jaw or back or diaphoresis. The patient has tried morphine for this at home which is helped but she is now become nauseous from this morning. Patient also complains of sob at rest and doe. She is unable to tolerate lying back as the chest pain worsens and her sob worsens. Since being seen on 05/09/17 her leg swelling has increased b/l. She has chronic distension of her abdomen due to neuroendocrine tumor. She notes chronic pain in her abdomen that has not changed since her last visit. No fever, chills, cough, hemoptysis. Patient is a never smoker. No family history of cardiac dz. She has never had an echo or cath done before.   HPI  Past Medical History:  Diagnosis Date  . Cancer ([place])   . Colon cancer ([place])   . Hypertension   . Liver cancer ([place])   . Liver cancer ([place])   . Tumor of rectum     Patient Active Problem List   Diagnosis Date Noted  . 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 Anxiety disorder 07/01/2015  . Neck pain 07/01/2015  .  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 Anemia, iron deficiency 04/06/2014  . Chemical conjunctivitis of left eye 04/26/2013  . Acid reflux 03/03/2013  . Gastrointestinal ulcer due to Helicobacter pylori 07/22/7251  . Neuroendocrine tumor 01/29/2013  . Functional constipation 01/29/2013  . Pelvic mass in female 01/05/2013    Past Surgical History:  Procedure Laterality Date  . FOOT SURGERY    . TUMOR REMOVAL     from rectum area 02/2013    OB History    No data available       Home Medications    Prior to Admission medications   Medication Sig Start Date End Date Taking? Authorizing Provider  amphetamine-dextroamphetamine (ADDERALL) 20 MG tablet Take 20 mg by mouth 2 (two) times daily.   Yes [provider]  cyclobenzaprine (FLEXERIL) 10 MG tablet Take 10 mg by mouth 3 (three) times daily as needed for muscle spasms.  03/18/17  Yes [provider]  escitalopram (LEXAPRO) 10 MG tablet Take 10 mg by mouth daily.  03/08/17  Yes [provider]  hydrochlorothiazide (HYDRODIURIL) 12.5 MG tablet Take 12.5 mg by mouth daily. 03/29/17  Yes [provider]  ibuprofen (ADVIL,MOTRIN) 200 MG tablet Take 400 mg by mouth every 6 (six) hours as needed for headache.   Yes [provider]  LORazepam (ATIVAN) 0.5 MG tablet Take 0.5 mg by mouth daily.  02/15/17  Yes [provider]  losartan (COZAAR) 100 MG tablet Take 100 mg by mouth daily. 10/12/16  Yes [provider]  medroxyPROGESTERone (DEPO-PROVERA) 150 MG/ML injection Inject 150 mg into the muscle every 3 (three) months. Last injection end of July 2018 03/16/17  Yes [provider]  morphine (MSIR) 30 MG tablet Take 1 tablet (30 mg total) by mouth every 6 (six) hours as needed for severe pain. Patient taking differently: Take 15 mg by mouth every 6 (six) hours as needed for severe pain.  05/09/17  Yes Mu, Pilar Plate, MD  omeprazole (PRILOSEC) 40 MG capsule Take 40 mg by mouth  daily.  02/18/17  Yes [provider]  ondansetron (ZOFRAN) 8 MG tablet Take 8 mg by mouth every 8 (eight) hours as needed for nausea or vomiting.  01/29/17  Yes [provider]  oxycodone (OXY-IR) 5 MG capsule Take 2.5 mg by mouth every 6 (six) hours as needed for pain.     [provider]  pregabalin (LYRICA) 50 MG capsule Take 50 mg by mouth daily as needed (pain).    [provider]    Family History Family History  Problem Relation Age of Onset  . Cancer Mother        pancreatic    Social History Social History  Substance Use Topics  . Smoking status: Never Smoker  . Smokeless tobacco: Never Used  . Alcohol use No     Allergies   Tramadol; Morphine and related; Codeine; Hydrocodone; Latex; Oxycodone; and Percocet [oxycodone-acetaminophen]   Review of Systems Review of Systems  All other systems reviewed and are negative.    Physical Exam Updated Vital Signs BP 118/89 (BP Location: Right Arm)   Pulse (!) 123   Temp (!) 97.5 F (36.4 C) (Oral)   Resp 19   Ht 5\' 6"  (1.676 m)   Wt 65.8 kg (145 lb)   LMP 04/25/2017 Comment: shielded  SpO2 97%   BMI 23.40 kg/m   Physical Exam  Constitutional: She appears well-developed and well-nourished.  Patient sitting upright in bed, uncomfortable.   HENT:  Head: Normocephalic and atraumatic.  Right Ear: External ear normal.  Left Ear: External ear normal.  Nose: Nose normal.  Mouth/Throat: Uvula is midline, oropharynx is clear and moist and mucous membranes are normal. No tonsillar exudate.  Eyes: Pupils are equal, round, and reactive to light. Right eye exhibits no discharge. Left eye exhibits no discharge. No scleral icterus.  Neck: Trachea normal and normal range of motion. Neck supple. No spinous process tenderness present. No neck rigidity. Normal range of motion present.  Cardiovascular: Regular rhythm and intact distal pulses.  Tachycardia present.   No murmur heard. Pulses:       Radial pulses are 2+ on the right side, and 2+ on the left side.       Dorsalis pedis pulses are 2+ on the right side, and 2+ on the left side.       Posterior tibial pulses are 2+ on the right side, and 2+ on the left side.  Lower extremity swelling and edema b/l. 2+ to level of midshin.   Pulmonary/Chest: Effort normal and breath sounds normal. No tachypnea. No respiratory distress. She exhibits tenderness.    Abdominal: Soft. There is no tenderness. There is no rebound and no guarding.  Palpable abdominal masses of upper abdomen with distension. Soft lower extremity abdomen.   Musculoskeletal: She exhibits no edema.  Lymphadenopathy:    She has no cervical adenopathy.  Neurological: She  is alert.  Skin: Skin is warm and dry. No rash noted. She is not diaphoretic.  Psychiatric: She has a normal mood and affect.  Nursing note and vitals reviewed.    ED Treatments / Results  Labs (all labs ordered are listed, but only abnormal results are displayed) Labs Reviewed  CBC  LIPASE, BLOOD  COMPREHENSIVE METABOLIC PANEL  I-STAT TROPONIN, ED  I-STAT BETA HCG BLOOD, ED ([facility], [facility], [facility] ONLY)    EKG  EKG Interpretation  Date/Time:  Thursday May 13 2017 11:56:19 EDT Ventricular Rate:  117 PR Interval:    QRS Duration: 102 QT Interval:  362 QTC Calculation: 506 R Axis:   -133 Text Interpretation:  Sinus tachycardia Multiform ventricular premature complexes Aberrant conduction of SV complex(es) Anterior infarct, age indeterminate Repol abnrm, severe global ischemia (LM/MVD) Baseline wander in lead(s) I II aVR aVF Confirmed by Davonna Belling [phone]) on 05/13/2017 1:38:20 PM       Radiology Dg Chest 2 View  Result Date: 05/13/2017 CLINICAL DATA:  Chest pain, nausea, shortness of breath and dizziness for 4 days. EXAM: CHEST  2 VIEW COMPARISON:  PA and lateral chest and CT chest 05/09/2017. FINDINGS: Asymmetric elevation of the right hemidiaphragm is again seen. There is trace right  pleural effusion. No left effusion. Lungs are clear. Heart size is normal. No pneumothorax. No acute bony abnormality. IMPRESSION: Trace right pleural effusion is new since the prior exams. The chest is otherwise negative. Electronically Signed   By: Inge Rise M.D.   On: 05/13/2017 13:08    Procedures Procedures (including critical care time)  Medications Ordered in ED Medications  sodium chloride 0.9 % bolus 500 mL (not administered)  ondansetron (ZOFRAN) injection 4 mg (4 mg Intravenous Given 05/13/17 1351)  ketorolac (TORADOL) 30 MG/ML injection 30 mg (30 mg Intravenous Given 05/13/17 1413)     Initial Impression / Assessment and Plan / ED Course  I have reviewed the triage vital signs and the nursing notes.  Pertinent labs & imaging results that were available during my care of the patient were reviewed by me and considered in my medical decision making (see chart for details).     51 year old female with history of neuroendocrine tumor who presents emergency department today for continued chest pain that.  She was seen on 05/09/14 for the same. Pain began 1 day prior. She had negative Tn x 2 and negative CTA for PE, aortic aneurysm or dissection. Patient abdominal CT showed mass of hepatomegaly from metastatic neuroendocrine tumor of the liver. Pain is continued and not changed in character. Moderate improvement in symptom control with morphine at home.  The patient is noted to be tachycardic on arrival.  Per chart review she was tachycardic during the previous visit.  Patient is without fever, tachypnea, hypoxia or hypotension. Exam with no tracheal deviation, no JVD or new murmur, regular rythm, breath sounds equal bilaterally.  Will obtain labs, ECG and CXR and provide nausea and pain relief. Will not reorder CT as patient has recently had negative workup in the last week.   Patient with tx at 0.00. Negative pregnancy. CBC reassuring. BMP reassuring. BNP 30.1. CXR without  cardiomegaly. There is trace pleural effusion. No infiltrates.  Patient given Toradol which brought pain to 2-3/10. She is now able to lay down without difficulty and says this improved much of her symptoms. 2nd troponin 0.00.    Patient has HEART score of 3. Do not feel the evaluation does not show pathology that would  require ongoing emergent intervention or inpatient treatment. I advised the patient to follow-up with PCP this week. I will tx the patient with NSAID's as this provided pain relief in the department. I advised the patient to return to the emergency department with new or worsening symptoms or new concerns. Specific return precautions discussed. The patient verbalized understanding and agreement with plan. All questions answered. No further questions at this time. The patient is hemodynamically stable, mentating appropriately and appears safe for discharge.  Case has been discussed with and seen by Dr. Alvino Chapel who agrees with the above plan to discharge.   Final Clinical Impressions(s) / ED Diagnoses   Final diagnoses:  Chest pain, unspecified type    New Prescriptions Discharge Medication List as of 05/13/2017  6:41 PM       Jillyn Ledger, PA-C 05/13/17 2240    Davonna Belling, MD 05/14/17 1530

## 2017-05-13 NOTE — ED Notes (Addendum)
ED Provider at bedside. Lab work to be drawn after providers assessment.

## 2017-05-13 NOTE — Discharge Instructions (Signed)
Read instructions below for reasons to return to the Emergency Department. It is recommended that your follow up with your Primary Care Doctor in regards to today's visit. If you do not have a doctor, use the resource guide listed below to help you find one. Begin taking over the counter Prilosec or Zegrid (omeprazole) as directed.     Tests performed today include: An EKG of your heart A chest x-ray Cardiac enzymes - a blood test for heart muscle damage Blood counts and electrolytes Vital signs. See below for your results today.   Chest Pain (Nonspecific)  HOME CARE INSTRUCTIONS  For the next few days, avoid physical activities that bring on chest pain. Continue physical activities as directed.  Do not smoke cigarettes or drink alcohol until your symptoms are gone. If you do smoke, it is time to quit. You may receive instructions and counseling on how to stop smoking. Only take over-the-counter or prescription medicine for pain, discomfort, or fever as directed by your caregiver.  Follow your caregiver's suggestions for further testing if your chest pain does not go away.  Keep any follow-up appointments you made. If you do not go to an appointment, you could develop lasting (chronic) problems with pain. If there is any problem keeping an appointment, you must call to reschedule.  SEEK MEDICAL CARE IF:  You think you are having problems from the medicine you are taking. Read your medicine instructions carefully.  Your chest pain does not go away, even after treatment.  You develop a rash with blisters on your chest.  SEEK IMMEDIATE MEDICAL CARE IF:  You have increased chest pain or pain that spreads to your arm, neck, jaw, back, or belly (abdomen).  You develop shortness of breath, an increasing cough, or you are coughing up blood.  You have severe back or abdominal pain, feel sick to your stomach (nauseous) or throw up (vomit).  You develop severe weakness, fainting, or chills.  You have  an oral temperature above 102 F (38.9 C), not controlled by medicine.  THIS IS AN EMERGENCY. Do not wait to see if the pain will go away. Get medical help at once. Call your local emergency services (911 in [place] Do not drive yourself to the hospital. Additional Information:  Your vital signs today were: BP 103/77    Pulse (!) 112    Temp (!) 97.5 F (36.4 C) (Oral)    Resp 17    Ht 5\' 6"  (1.676 m)    Wt 65.8 kg (145 lb)    LMP 04/25/2017 Comment: shielded   SpO2 98%    BMI 23.40 kg/m  If your blood pressure (BP) was elevated above 135/85 this visit, please have this repeated by your doctor within one month. ---------------

## 2017-05-13 NOTE — ED Triage Notes (Addendum)
Patient c/o intermittent left chest pain under the breast area x 5 days ago. Patient also c/o SOB when ambulating x 5 days. Patient has swelling to bilateral lower extremities x 3 days . Patient was seen 4 days ago at [facility] ED for the same.

## 2017-10-28 IMAGING — CR DG CHEST 2V
2 series · 2 of 2 positions shown · non-contrast
Comparison: Prior radiograph from 03/17/2017.

CLINICAL DATA: Initial evaluation for acute chest pain, shortness
breath, tachycardia.

EXAM:
CHEST  2 VIEW

[chest lat]
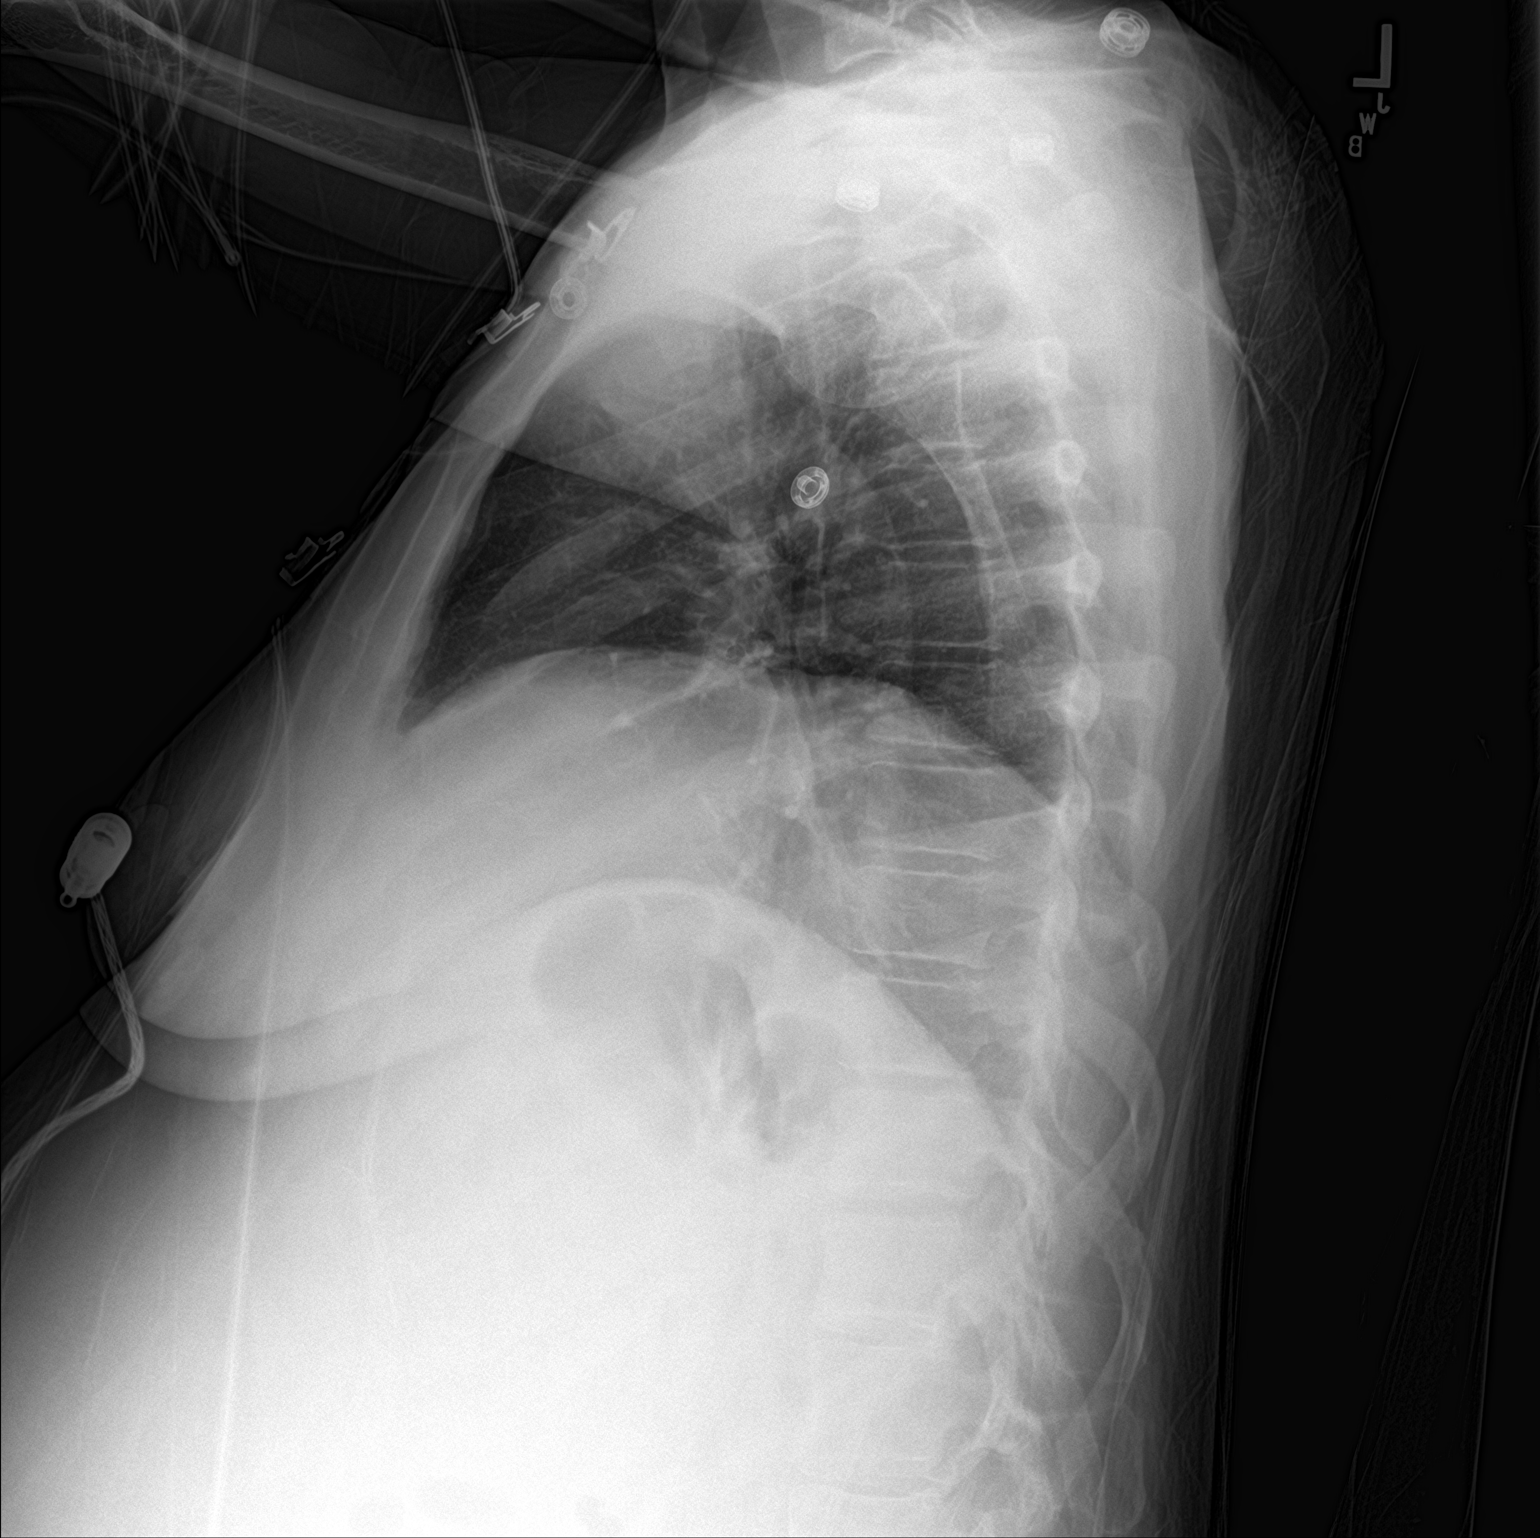

[chest ap]
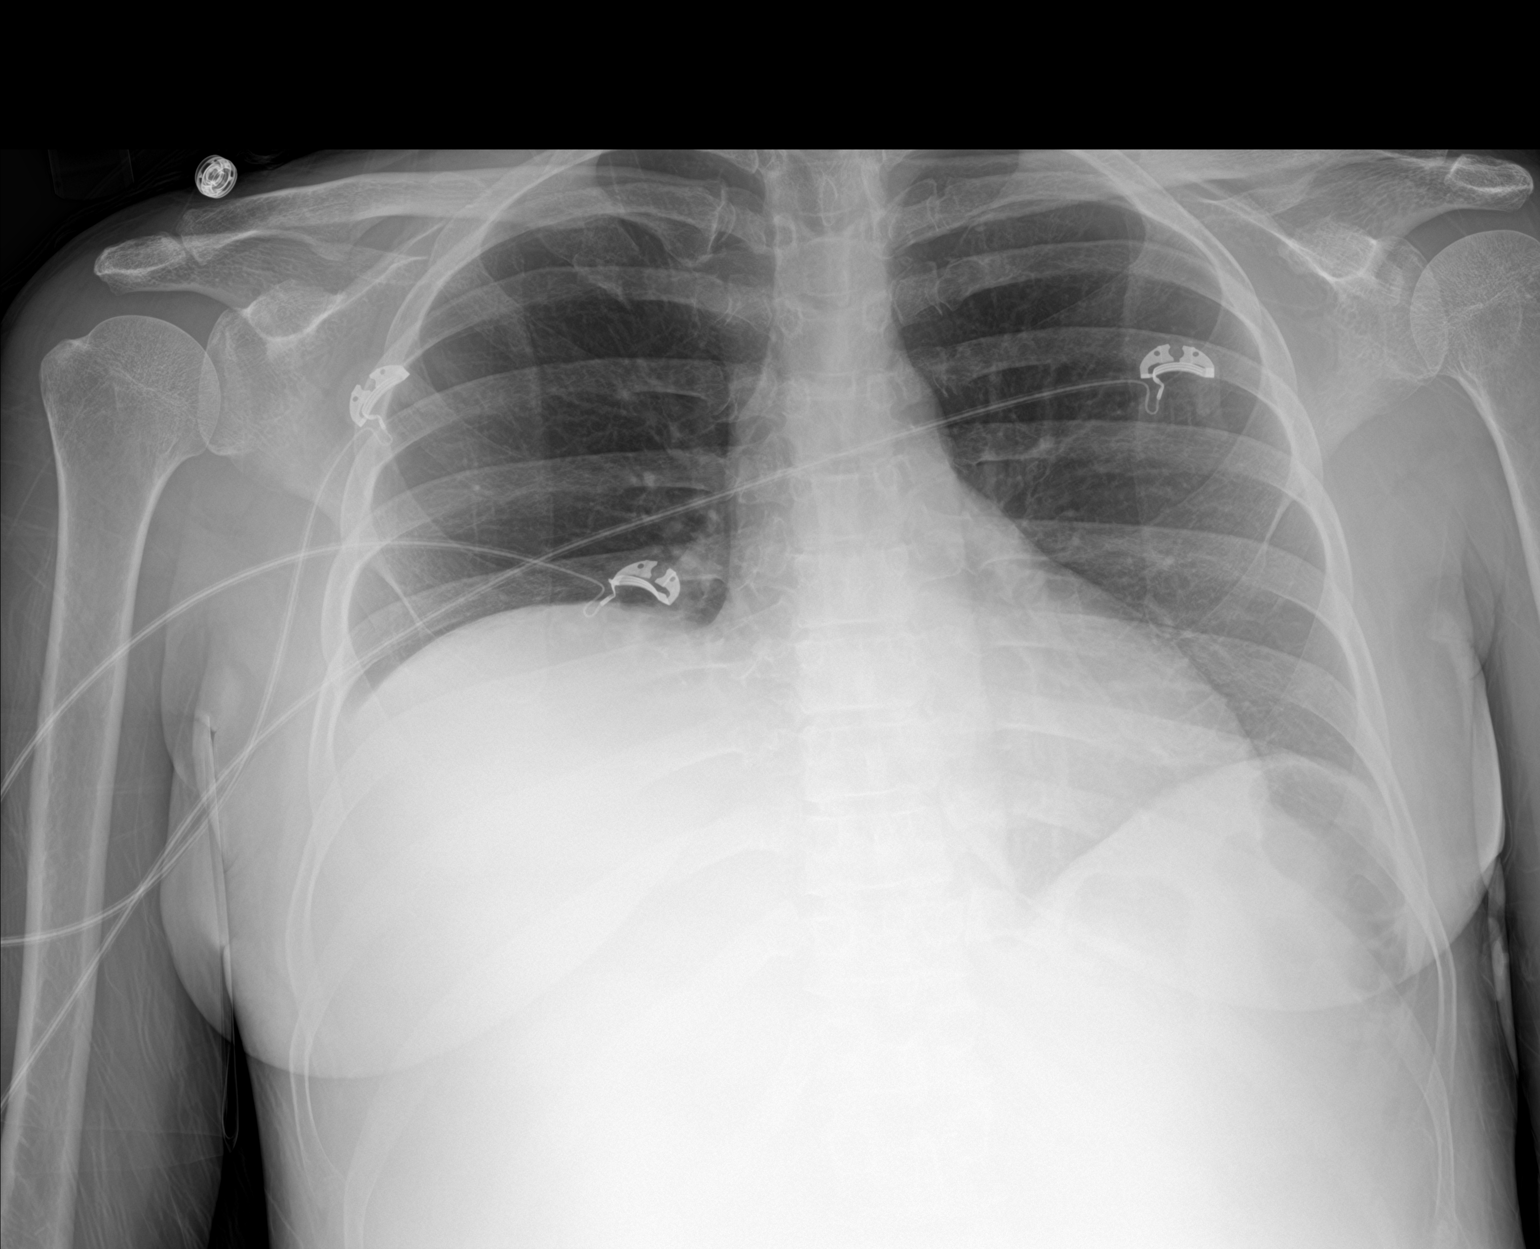

[2 of 2 positions shown; findings below may reference images not displayed]

FINDINGS: The cardiac and mediastinal silhouettes are stable in size and
contour, and remain within normal limits.

The lungs are hypoinflated with elevation of the right
hemidiaphragm, similar to previous. No airspace consolidation,
pleural effusion, or pulmonary edema is identified. There is no
pneumothorax.

No acute osseous abnormality identified.
IMPRESSION: 1. No active cardiopulmonary disease.
2. Chronic elevation of the right hemidiaphragm, stable.

## 2017-11-01 IMAGING — DX DG CHEST 2V
2 series · 2 of 2 positions shown · non-contrast
Comparison: PA and lateral chest and CT chest 05/09/2017.

CLINICAL DATA: Chest pain, nausea, shortness of breath and
dizziness for 4 days.

EXAM:
CHEST  2 VIEW

[chest pa]
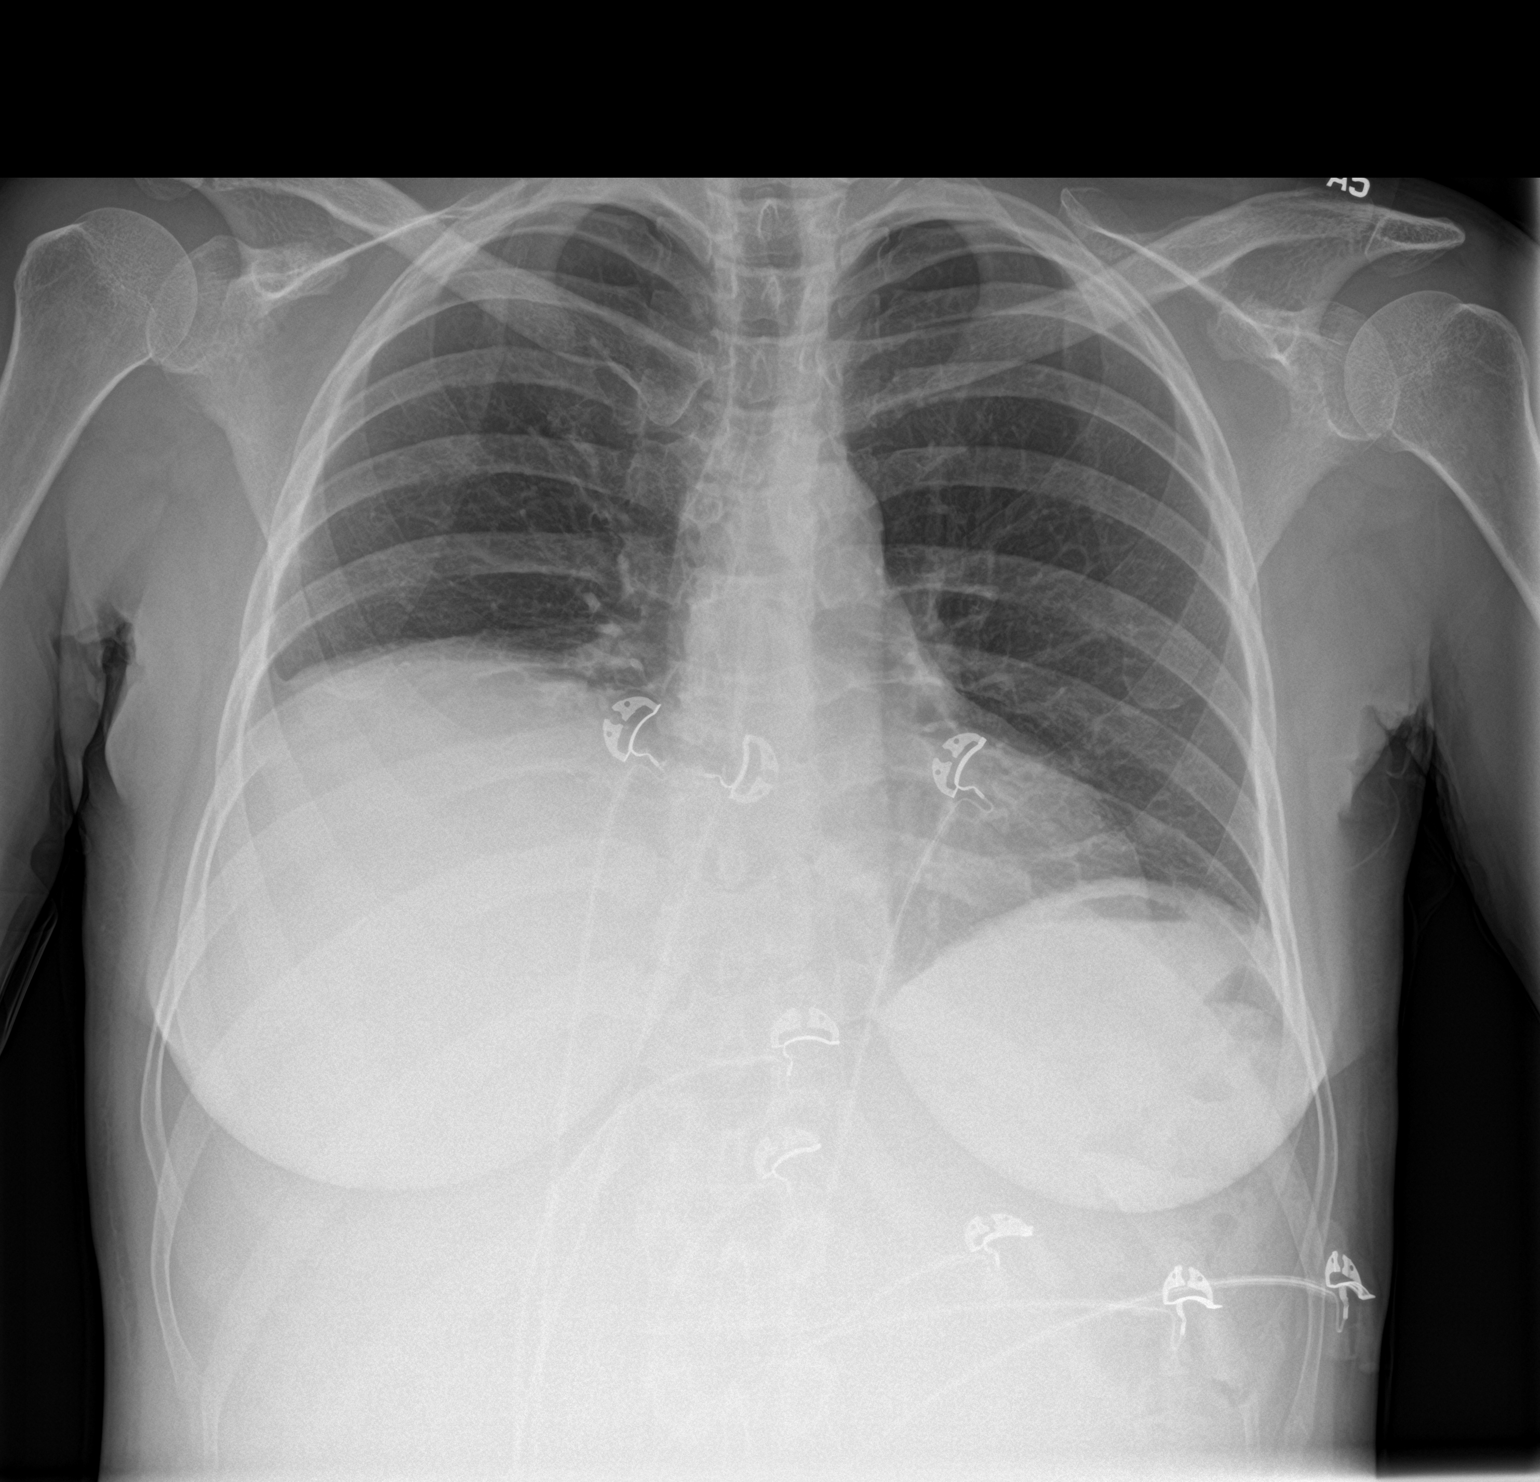

[chest lat]
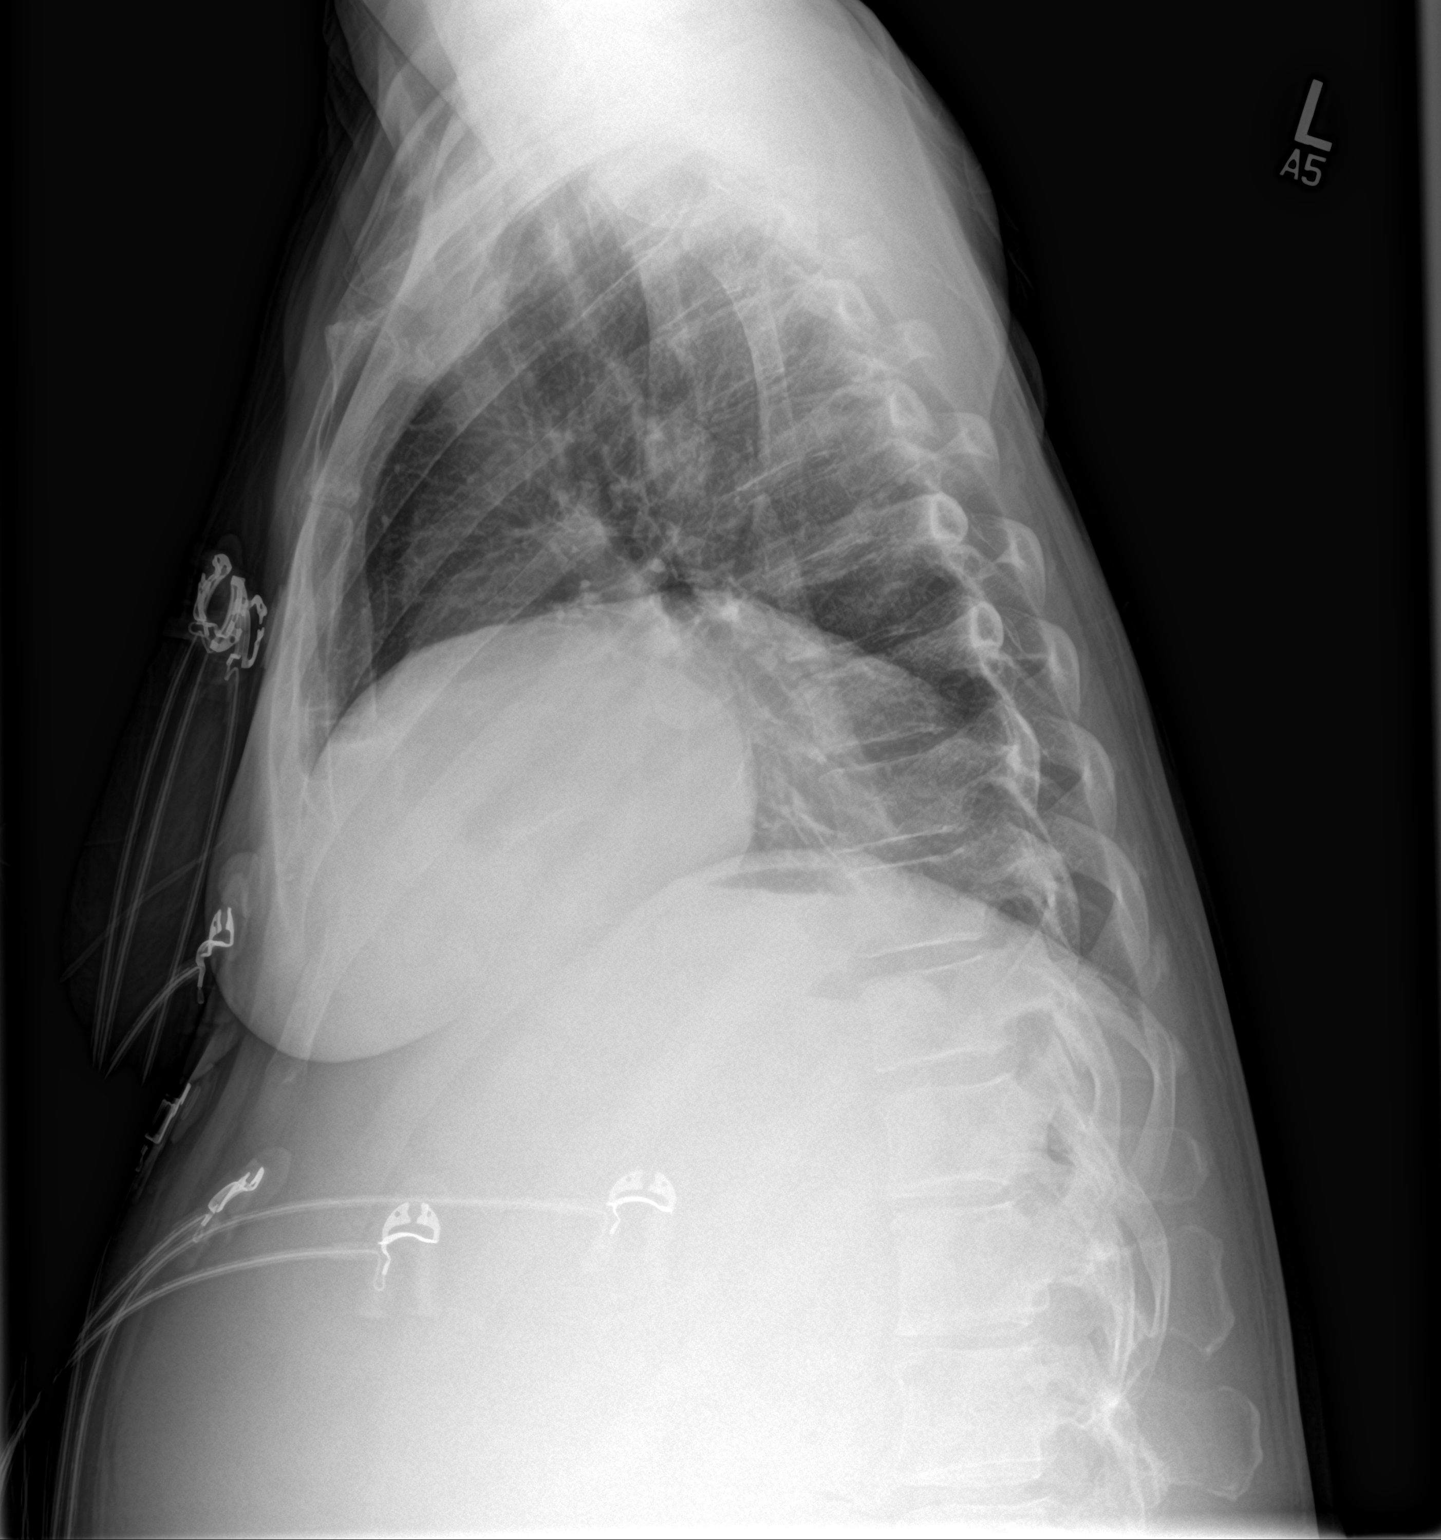

[2 of 2 positions shown; findings below may reference images not displayed]

FINDINGS: Asymmetric elevation of the right hemidiaphragm is again seen. There
is trace right pleural effusion. No left effusion. Lungs are clear.
Heart size is normal. No pneumothorax. No acute bony abnormality.
IMPRESSION: Trace right pleural effusion is new since the prior exams. The chest
is otherwise negative.
